# Patient Record
Sex: Female | Born: 1990 | Race: Black or African American | Hispanic: No | Marital: Single | Smoking: Former smoker
Health system: Southern US, Community
[De-identification: ages and names within clinical notes are randomized; demographics above are authoritative.]

## PROBLEM LIST (undated history)

## (undated) DIAGNOSIS — Z789 Other specified health status: Secondary | ICD-10-CM

## (undated) HISTORY — PX: NO PAST SURGERIES: SHX2092

---

## 2015-01-21 ENCOUNTER — Emergency Department (HOSPITAL_COMMUNITY)
Admission: EM | Admit: 2015-01-21 | Discharge: 2015-01-21 | Disposition: A | Discharge status: Home / Self Care | Payer: Self-pay | Attending: Emergency Medicine | Admitting: Emergency Medicine

## 2015-01-21 ENCOUNTER — Encounter (HOSPITAL_COMMUNITY): Payer: Self-pay | Admitting: Emergency Medicine

## 2015-01-21 DIAGNOSIS — Z720 Tobacco use: Secondary | ICD-10-CM | POA: Insufficient documentation

## 2015-01-21 DIAGNOSIS — K644 Residual hemorrhoidal skin tags: Secondary | ICD-10-CM | POA: Insufficient documentation

## 2015-01-21 DIAGNOSIS — Z79899 Other long term (current) drug therapy: Secondary | ICD-10-CM | POA: Insufficient documentation

## 2015-01-21 DIAGNOSIS — Z7951 Long term (current) use of inhaled steroids: Secondary | ICD-10-CM | POA: Insufficient documentation

## 2015-01-21 DIAGNOSIS — K921 Melena: Secondary | ICD-10-CM | POA: Insufficient documentation

## 2015-01-21 LAB — I-STAT CHEM 8, ED
BUN: 8 mg/dL (ref 6–20)
CALCIUM ION: 1.12 mmol/L (ref 1.12–1.23)
CHLORIDE: 105 mmol/L (ref 101–111)
CREATININE: 0.8 mg/dL (ref 0.44–1.00)
GLUCOSE: 100 mg/dL — AB (ref 65–99)
HCT: 40 % (ref 36.0–46.0)
Hemoglobin: 13.6 g/dL (ref 12.0–15.0)
Potassium: 3.8 mmol/L (ref 3.5–5.1)
Sodium: 137 mmol/L (ref 135–145)
TCO2: 22 mmol/L (ref 0–100)

## 2015-01-21 LAB — POC OCCULT BLOOD, ED: Fecal Occult Bld: POSITIVE — AB

## 2015-01-21 MED ORDER — HYDROCORTISONE 2.5 % RE CREA: TOPICAL_CREAM | RECTAL | Status: DC

## 2015-01-21 MED ORDER — DOCUSATE SODIUM 100 MG PO CAPS: 100.0000 mg | ORAL_CAPSULE | Freq: Two times a day (BID) | ORAL | Status: DC

## 2015-01-21 NOTE — ED Provider Notes (Signed)
Medical screening examination/treatment/procedure(s) were conducted as a shared visit with non-physician practitioner(s) and myself.  I personally evaluated the patient during the encounter.  Patient with rectal bleeding. Likely related to anal intercourse. No fevers, nausea, vomiting, vaginal or urinary symptoms. Exam done without evidence of hemorrhoids. She was Hemoccult positive. Has no abdominal tenderness heart regular rate and rhythm lungs clear to auscultation. Likely related to intercourse and possibly has some fissures. Doubt serious injuries. Treat supportively at home.   EKG Interpretation None        Marily Memos, MD 01/22/15 [phone]

## 2015-01-21 NOTE — ED Notes (Signed)
Pt states she is having blood in her stool  Pt states today is the 4th day she has had it  Pt states it is enough to turn the water in the toilet red  Pt states she had discomfort when she has a bowel movement  Pt states she is regular with her bowel movements  Pt states she does have anal sex

## 2015-01-21 NOTE — ED Provider Notes (Signed)
CSN: [id number]     Arrival date & time 01/21/15  2001 History   First MD Initiated Contact with Patient 01/21/15 2231     Chief Complaint  Patient presents with  . Melena    (Consider location/radiation/quality/duration/timing/severity/associated sxs/prior Treatment) HPI Comments: 24 year old female presents to the emergency department for further evaluation of acute ear. Patient states that she has had 4 days of blood mixed with brown stool when having a bowel movement only. She states that she has had enough blood to "turn the water in the toilet red". She denies any passage of clots or persistent bleeding following a bowel movement. She states that a bowel movement usually aggravates her rectal pain. No abdominal pain, fever, N/V, urinary symptoms, vaginal complaints. No hx of hemorrhoid or anal fissure. Patient does engage in anal sex; las occurrence was 1.5 weeks ago. No hx of abdominal surgeries. Patient is not on blood thinners.  The history is provided by the patient. No language interpreter was used.    History reviewed. No pertinent past medical history. History reviewed. No pertinent past surgical history. Family History  Problem Relation Age of Onset  . Diabetes Other    Social History  Substance Use Topics  . Smoking status: Current Every Day Smoker    Types: Cigarettes  . Smokeless tobacco: None  . Alcohol Use: Yes     Comment: occ   OB History    No data available      Review of Systems  Constitutional: Negative for fever.  Respiratory: Negative for shortness of breath.   Cardiovascular: Negative for chest pain.  Gastrointestinal: Positive for blood in stool and rectal pain.  Genitourinary: Negative for dysuria and hematuria.  All other systems reviewed and are negative.   Allergies  Review of patient's allergies indicates no known allergies.  Home Medications   Prior to Admission medications   Medication Sig Start Date End Date Taking? Authorizing  Provider  etonogestrel (NEXPLANON) 68 MG IMPL implant 1 each by Subdermal route once.   Yes Historical Provider, MD  Multiple Vitamin (MULTIVITAMIN WITH MINERALS) TABS tablet Take 1 tablet by mouth daily.   Yes Historical Provider, MD  triamcinolone (NASACORT) 55 MCG/ACT AERO nasal inhaler Place 2 sprays into the nose daily.   Yes Historical Provider, MD  docusate sodium (COLACE) 100 MG capsule Take 1 capsule (100 mg total) by mouth every 12 (twelve) hours. 01/21/15   Antony Madura, PA-C  hydrocortisone (ANUSOL-HC) 2.5 % rectal cream Apply rectally 2 times daily 01/21/15   Antony Madura, PA-C   BP 143/81 mmHg  Pulse 101  Temp(Src) 98.3 F (36.8 C) (Oral)  Resp 16  SpO2 100%  LMP 12/24/2014 (Approximate)   Physical Exam  Constitutional: She is oriented to person, place, and time. She appears well-developed and well-nourished. No distress.  Nontoxic/nonseptic appearing  HENT:  Head: Normocephalic and atraumatic.  Eyes: Conjunctivae and EOM are normal. No scleral icterus.  Neck: Normal range of motion.  Pulmonary/Chest: Effort normal. No respiratory distress. She has no wheezes.  Respirations even and unlabored  Abdominal: Soft. She exhibits no distension. There is no tenderness. There is no rebound and no guarding.  Soft, obese; nontender without peritoneal signs.  Genitourinary: Rectal exam shows external hemorrhoid (non-thrombosed at 6 o'clock; nontender). Rectal exam shows no internal hemorrhoid, no fissure, no mass, no tenderness and anal tone normal.  No anal fissure. No gross blood on DRE. Soft, brown stool noted.  Musculoskeletal: Normal range of motion.  Neurological:  She is alert and oriented to person, place, and time. She exhibits normal muscle tone. Coordination normal.  Skin: Skin is warm and dry. No rash noted. She is not diaphoretic. No erythema. No pallor.  Psychiatric: She has a normal mood and affect. Her behavior is normal.  Nursing note and vitals reviewed.   ED Course   Procedures (including critical care time) Labs Review Labs Reviewed  I-STAT CHEM 8, ED - Abnormal; Notable for the following:    Glucose, Bld 100 (*)    All other components within normal limits  POC OCCULT BLOOD, ED - Abnormal; Notable for the following:    Fecal Occult Bld POSITIVE (*)    All other components within normal limits    Imaging Review No results found.   EKG Interpretation None      MDM   Final diagnoses:  Hematochezia    Patient presenting for hematochezia x 4 days. DRE with no evidence of gross blood; hemoccult is positive for microscopic hematochezia, but H/H is WNL. Patient without hypotension. No c/o syncope or lightheadedness. She does have an external hemorrhoid on exam which does not appear thrombosed. Also possible that recent anal intercourse may have caused onset of symptoms today. Will treat supportively with sitz bath, Anusol HC, and Colace. Patient advised to follow up with her primary care doctor for recheck. Return precautions discussed and provided. Patient discharged in good condition unaddressed concerns.    Antony Madura, PA-C 01/21/15 2321  Marily Memos, MD 01/22/15 [phone]

## 2015-01-21 NOTE — Discharge Instructions (Signed)
Recommend you do sitz baths 3-4 times per day. Use Anusol HC as prescribed and Colace to prevent constipation. Avoid any anal trauma which may worsen symptoms. Follow up with your primary care doctor for symptoms recheck within the week.  Sitz Bath A sitz bath is a warm water bath taken in the sitting position that covers only the hips and buttocks. It may be used for either healing or hygiene purposes. Sitz baths are also used to relieve pain, itching, or muscle spasms. The water may contain medicine. Moist heat will help you heal and relax.  HOME CARE INSTRUCTIONS  Take 3 to 4 sitz baths a day. 1. Fill the bathtub half full with warm water. 2. Sit in the water and open the drain a little. 3. Turn on the warm water to keep the tub half full. Keep the water running constantly. 4. Soak in the water for 15 to 20 minutes. 5. After the sitz bath, pat the affected area dry first. SEEK MEDICAL CARE IF:  You get worse instead of better. Stop the sitz baths if you get worse. MAKE SURE YOU:  Understand these instructions.  Will watch your condition.  Will get help right away if you are not doing well or get worse. Document Released: 02/20/2004 Document Revised: 02/22/2012 Document Reviewed: 08/27/2010 [facility] Patient Information 2015 [place], [place]. This information is not intended to replace advice given to you by your health care provider. Make sure you discuss any questions you have with your health care provider.  Hemorrhoids Hemorrhoids are swollen veins around the rectum or anus. There are two types of hemorrhoids:  6. Internal hemorrhoids. These occur in the veins just inside the rectum. They may poke through to the outside and become irritated and painful. 7. External hemorrhoids. These occur in the veins outside the anus and can be felt as a painful swelling or hard lump near the anus. CAUSES  Pregnancy.   Obesity.   Constipation or diarrhea.   Straining to have a bowel  movement.   Sitting for long periods on the toilet.  Heavy lifting or other activity that caused you to strain.  Anal intercourse. SYMPTOMS   Pain.   Anal itching or irritation.   Rectal bleeding.   Fecal leakage.   Anal swelling.   One or more lumps around the anus.  DIAGNOSIS  Your caregiver may be able to diagnose hemorrhoids by visual examination. Other examinations or tests that may be performed include:   Examination of the rectal area with a gloved hand (digital rectal exam).   Examination of anal canal using a small tube (scope).   A blood test if you have lost a significant amount of blood.  A test to look inside the colon (sigmoidoscopy or colonoscopy). TREATMENT Most hemorrhoids can be treated at home. However, if symptoms do not seem to be getting better or if you have a lot of rectal bleeding, your caregiver may perform a procedure to help make the hemorrhoids get smaller or remove them completely. Possible treatments include:   Placing a rubber band at the base of the hemorrhoid to cut off the circulation (rubber band ligation).   Injecting a chemical to shrink the hemorrhoid (sclerotherapy).   Using a tool to burn the hemorrhoid (infrared light therapy).   Surgically removing the hemorrhoid (hemorrhoidectomy).   Stapling the hemorrhoid to block blood flow to the tissue (hemorrhoid stapling).  HOME CARE INSTRUCTIONS   Eat foods with fiber, such as whole grains, beans, nuts, fruits,  and vegetables. Ask your doctor about taking products with added fiber in them (fibersupplements).  Increase fluid intake. Drink enough water and fluids to keep your urine clear or pale yellow.   Exercise regularly.   Go to the bathroom when you have the urge to have a bowel movement. Do not wait.   Avoid straining to have bowel movements.   Keep the anal area dry and clean. Use wet toilet paper or moist towelettes after a bowel movement.   Medicated  creams and suppositories may be used or applied as directed.   Only take over-the-counter or prescription medicines as directed by your caregiver.   Take warm sitz baths for 15-20 minutes, 3-4 times a day to ease pain and discomfort.   Place ice packs on the hemorrhoids if they are tender and swollen. Using ice packs between sitz baths may be helpful.   Put ice in a plastic bag.   Place a towel between your skin and the bag.   Leave the ice on for 15-20 minutes, 3-4 times a day.   Do not use a donut-shaped pillow or sit on the toilet for long periods. This increases blood pooling and pain.  SEEK MEDICAL CARE IF:  You have increasing pain and swelling that is not controlled by treatment or medicine.  You have uncontrolled bleeding.  You have difficulty or you are unable to have a bowel movement.  You have pain or inflammation outside the area of the hemorrhoids. MAKE SURE YOU:  Understand these instructions.  Will watch your condition.  Will get help right away if you are not doing well or get worse. Document Released: 05/27/2000 Document Revised: 05/16/2012 Document Reviewed: 04/03/2012 [facility] Patient Information 2015 [place], [place]. This information is not intended to replace advice given to you by your health care provider. Make sure you discuss any questions you have with your health care provider.

## 2015-09-16 ENCOUNTER — Emergency Department (HOSPITAL_COMMUNITY)
Admission: EM | Admit: 2015-09-16 | Discharge: 2015-09-16 | Disposition: A | Discharge status: Home / Self Care | Payer: BC Managed Care – PPO | Attending: Emergency Medicine | Admitting: Emergency Medicine

## 2015-09-16 ENCOUNTER — Encounter (HOSPITAL_COMMUNITY): Payer: Self-pay

## 2015-09-16 DIAGNOSIS — N760 Acute vaginitis: Secondary | ICD-10-CM | POA: Insufficient documentation

## 2015-09-16 DIAGNOSIS — Z3202 Encounter for pregnancy test, result negative: Secondary | ICD-10-CM | POA: Diagnosis not present

## 2015-09-16 DIAGNOSIS — N939 Abnormal uterine and vaginal bleeding, unspecified: Secondary | ICD-10-CM

## 2015-09-16 DIAGNOSIS — B9689 Other specified bacterial agents as the cause of diseases classified elsewhere: Secondary | ICD-10-CM

## 2015-09-16 DIAGNOSIS — Z79899 Other long term (current) drug therapy: Secondary | ICD-10-CM | POA: Diagnosis not present

## 2015-09-16 DIAGNOSIS — F17210 Nicotine dependence, cigarettes, uncomplicated: Secondary | ICD-10-CM | POA: Insufficient documentation

## 2015-09-16 LAB — I-STAT BETA HCG BLOOD, ED (MC, WL, AP ONLY)

## 2015-09-16 LAB — COMPREHENSIVE METABOLIC PANEL
ALT: 16 U/L (ref 14–54)
AST: 17 U/L (ref 15–41)
Albumin: 4 g/dL (ref 3.5–5.0)
Alkaline Phosphatase: 41 U/L (ref 38–126)
Anion gap: 5 (ref 5–15)
BILIRUBIN TOTAL: 0.2 mg/dL — AB (ref 0.3–1.2)
BUN: 10 mg/dL (ref 6–20)
CO2: 24 mmol/L (ref 22–32)
Calcium: 8.6 mg/dL — ABNORMAL LOW (ref 8.9–10.3)
Chloride: 110 mmol/L (ref 101–111)
Creatinine, Ser: 0.81 mg/dL (ref 0.44–1.00)
GFR calc Af Amer: >60 mL/min (ref >60)
Glucose, Bld: 107 mg/dL — ABNORMAL HIGH (ref 65–99)
Potassium: 3.4 mmol/L — ABNORMAL LOW (ref 3.5–5.1)
Sodium: 139 mmol/L (ref 135–145)
TOTAL PROTEIN: 6.8 g/dL (ref 6.5–8.1)

## 2015-09-16 LAB — WET PREP, GENITAL
Sperm: NONE SEEN
Trich, Wet Prep: NONE SEEN
Yeast Wet Prep HPF POC: NONE SEEN

## 2015-09-16 LAB — CBC
HCT: 38.1 % (ref 36.0–46.0)
Hemoglobin: 13.1 g/dL (ref 12.0–15.0)
MCH: 29.6 pg (ref 26.0–34.0)
MCHC: 34.4 g/dL (ref 30.0–36.0)
MCV: 86.2 fL (ref 78.0–100.0)
PLATELETS: 246 10*3/uL (ref 150–400)
RBC: 4.42 MIL/uL (ref 3.87–5.11)
RDW: 12.8 % (ref 11.5–15.5)
WBC: 6 10*3/uL (ref 4.0–10.5)

## 2015-09-16 MED ORDER — CEFTRIAXONE SODIUM 250 MG IJ SOLR
250.0000 mg | Freq: Once | INTRAMUSCULAR | Status: AC
  Administered 2015-09-16: 250 mg via INTRAMUSCULAR
  Filled 2015-09-16: qty 250

## 2015-09-16 MED ORDER — STERILE WATER FOR INJECTION IJ SOLN
INTRAMUSCULAR | Status: AC
  Administered 2015-09-16: 10 mL
  Filled 2015-09-16: qty 10

## 2015-09-16 MED ORDER — METRONIDAZOLE 500 MG PO TABS: 500.0000 mg | ORAL_TABLET | Freq: Two times a day (BID) | ORAL | Status: DC

## 2015-09-16 MED ORDER — AZITHROMYCIN 250 MG PO TABS
1000.0000 mg | ORAL_TABLET | Freq: Once | ORAL | Status: AC
  Administered 2015-09-16: 1000 mg via ORAL
  Filled 2015-09-16: qty 4

## 2015-09-16 MED ORDER — TRIAMCINOLONE ACETONIDE 0.1 % EX CREA: 1.0000 "application " | TOPICAL_CREAM | Freq: Two times a day (BID) | CUTANEOUS | Status: DC

## 2015-09-16 NOTE — Discharge Instructions (Signed)
Ms. Donna Chavez,  Nice meeting you! Please follow-up with gynecology and establish care with a primary care provider. Return to the emergency department if you develop increased vaginal bleeding, abdominal pain, or new/worsening symptoms. Feel better soon!  S. Lane HackerNicole Dulcinea Kinser, PA-C

## 2015-09-16 NOTE — ED Notes (Signed)
Pt c/o reports increased vaginal bleeding and intermittent abdominal pain x 4-5 months.  Denies pain.  Pt reports having Nexplanon placed in May 2016.  [facility]plant was [facility] a [facility] [place].

## 2015-09-16 NOTE — ED Provider Notes (Signed)
CSN: [id number]     Arrival date & time 09/16/15  1317 History   First MD Initiated Contact with Patient 09/16/15 1733     Chief Complaint  Patient presents with  . Vaginal Bleeding   HPI   Donna Chavez is a 25 y.o. female with no significant past medical history presenting with a 1 month history of vaginal bleeding. She states over the last 22 days, she has had heavy, intermittent vaginal bleeding. She had nexplanon placed in May 2016 in [place]. She denies fevers, chills, chest pain, shortness of breath, abdominal pain, other vaginal complaints, injury, changes in bowel or bladder habits.  History reviewed. No pertinent past medical history. History reviewed. No pertinent past surgical history. Family History  Problem Relation Age of Onset  . Diabetes Other    Social History  Substance Use Topics  . Smoking status: Current Every Day Smoker    Types: Cigars  . Smokeless tobacco: None  . Alcohol Use: Yes     Comment: occ   OB History    No data available     Review of Systems  Ten systems are reviewed and are negative for acute change except as noted in the HPI  Allergies  Review of patient's allergies indicates no known allergies.  Home Medications   Prior to Admission medications   Medication Sig Start Date End Date Taking? Authorizing Provider  Calcium Polycarbophil (FIBER-CAPS PO) Take 1 capsule by mouth daily.   Yes Historical Provider, MD  etonogestrel (NEXPLANON) 68 MG IMPL implant 1 each by Subdermal route once. Placed 11/11/2014   Yes Historical Provider, MD  Multiple Vitamin (MULTIVITAMIN WITH MINERALS) TABS tablet Take 1 tablet by mouth daily. Reported on 09/16/2015   Yes Historical Provider, MD   BP 140/93 mmHg  Pulse 88  Temp(Src) 98.7 F (37.1 C) (Oral)  Resp 13  SpO2 97%  LMP 09/15/2015 Physical Exam  Constitutional: She appears well-developed and well-nourished. No distress.  HENT:  Head: Normocephalic and atraumatic.  Mouth/Throat: Oropharynx  is clear and moist. No oropharyngeal exudate.  Eyes: Conjunctivae are normal. Pupils are equal, round, and reactive to light. Right eye exhibits no discharge. Left eye exhibits no discharge. No scleral icterus.  Neck: No tracheal deviation present.  Cardiovascular: Normal rate, regular rhythm, normal heart sounds and intact distal pulses.  Exam reveals no gallop and no friction rub.   No murmur heard. Pulmonary/Chest: Effort normal and breath sounds normal. No respiratory distress. She has no wheezes. She has no rales. She exhibits no tenderness.  Abdominal: Soft. Bowel sounds are normal. She exhibits no distension and no mass. There is no tenderness. There is no rebound and no guarding.  Genitourinary:  Pelvic exam: normal external genitalia, vulva, vagina, uterus. Mild right adnexal tenderness. Invagination of cervix at 10 o'clock. Scant amount of blood in vaginal vault.    Musculoskeletal: She exhibits no edema.  Lymphadenopathy:    She has no cervical adenopathy.  Neurological: She is alert. Coordination normal.  Skin: Skin is warm and dry. No rash noted. She is not diaphoretic. No erythema.  Psychiatric: She has a normal mood and affect. Her behavior is normal.  Nursing note and vitals reviewed.   ED Course  Procedures  Labs Review Labs Reviewed  WET PREP, GENITAL - Abnormal; Notable for the following:    Clue Cells Wet Prep HPF POC PRESENT (*)    WBC, Wet Prep HPF POC FEW (*)    All other components within normal limits  COMPREHENSIVE  METABOLIC PANEL - Abnormal; Notable for the following:    Potassium 3.4 (*)    Glucose, Bld 107 (*)    Calcium 8.6 (*)    Total Bilirubin 0.2 (*)    All other components within normal limits  CBC  RPR  HIV ANTIBODY (ROUTINE TESTING)  I-STAT BETA HCG BLOOD, ED ([facility], [facility], [facility] ONLY)  GC/CHLAMYDIA PROBE AMP ([place]) NOT AT [facility]   MDM   Final diagnoses:  Vaginal bleeding  Bacterial vaginosis   Patient nontoxic-appearing, vital signs  stable. HCG, CMP, CBC unremarkable. Patient has been bleeding for approximately one month and hemoglobin is 13.1 today. Patient is not hypotensive, does not appear pale. Will perform pelvic exam and obtain STI workup, as well as refer to gynecology. Pelvic exam demonstrated scant amount of blood in vaginal vault.  Wet prep demonstrates bacterial vaginosis. Due to mild right adnexal tenderness, will treat empirically for STI today. Patient is requesting a prescription for triamcinolone cream for her eczema (her prescription ran out). Patient may be safely discharged at this time with Flagyl. Discussed return precautions. Patient in understanding and agreement with the plan. Patient is to follow-up with gynecology and primary care provider.  Melton Krebs, PA-C 09/16/15 2012  Arby Barrette, MD 09/17/15 [phone]

## 2015-09-16 NOTE — ED Notes (Signed)
Have made three calls to collect blood samples checked lobby, bathrooms,parking area,with no answer.

## 2015-09-17 ENCOUNTER — Telehealth (HOSPITAL_BASED_OUTPATIENT_CLINIC_OR_DEPARTMENT_OTHER): Payer: Self-pay | Admitting: Emergency Medicine

## 2015-09-17 LAB — GC/CHLAMYDIA PROBE AMP (~~LOC~~) NOT AT ARMC
CHLAMYDIA, DNA PROBE: NEGATIVE
Neisseria Gonorrhea: NEGATIVE

## 2015-09-17 LAB — RPR: RPR Ser Ql: NONREACTIVE

## 2015-09-17 LAB — HIV ANTIBODY (ROUTINE TESTING W REFLEX): HIV SCREEN 4TH GENERATION: NONREACTIVE

## 2016-11-24 ENCOUNTER — Ambulatory Visit (INDEPENDENT_AMBULATORY_CARE_PROVIDER_SITE_OTHER): Payer: Worker's Compensation

## 2016-11-24 ENCOUNTER — Encounter: Payer: Self-pay | Admitting: Emergency Medicine

## 2016-11-24 ENCOUNTER — Ambulatory Visit (INDEPENDENT_AMBULATORY_CARE_PROVIDER_SITE_OTHER): Payer: Worker's Compensation | Admitting: Emergency Medicine

## 2016-11-24 VITALS — BP 142/81 | HR 99 | Temp 98.1°F | Resp 16

## 2016-11-24 DIAGNOSIS — S93402A Sprain of unspecified ligament of left ankle, initial encounter: Secondary | ICD-10-CM

## 2016-11-24 DIAGNOSIS — S9002XA Contusion of left ankle, initial encounter: Secondary | ICD-10-CM

## 2016-11-24 DIAGNOSIS — S40011A Contusion of right shoulder, initial encounter: Secondary | ICD-10-CM

## 2016-11-24 DIAGNOSIS — S40021A Contusion of right upper arm, initial encounter: Secondary | ICD-10-CM
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
## 2016-11-24 NOTE — Patient Instructions (Addendum)
IF you received an x-ray today, you will receive an invoice from [facility]. Please contact [facility] at [phone] with questions or concerns regarding your invoice.   IF you received labwork today, you will receive an invoice from [facility]. Please contact [facility] at [phone] with questions or concerns regarding your invoice.   Our billing staff will not be able to assist you with questions regarding bills from these companies.  You will be contacted with the lab results as soon as they are available. The fastest way to get your results is to activate your My Chart account. Instructions are located on the last page of this paperwork. If you have not heard from us regarding the results in 2 weeks, please contact this office.      Contusion A contusion is a deep bruise. Contusions happen when an injury causes bleeding under the skin. Symptoms of bruising include pain, swelling, and discolored skin. The skin may turn blue, purple, or yellow. Follow these instructions at home:  Rest the injured area.  If told, put ice on the injured area. ? Put ice in a plastic bag. ? Place a towel between your skin and the bag. ? Leave the ice on for 20 minutes, 2-3 times per day.  If told, put light pressure (compression) on the injured area using an elastic bandage. Make sure the bandage is not too tight. Remove it and put it back on as told by your doctor.  If possible, raise (elevate) the injured area above the level of your heart while you are sitting or lying down.  Take over-the-counter and prescription medicines only as told by your doctor. Contact a doctor if:  Your symptoms do not get better after several days of treatment.  Your symptoms get worse.  You have trouble moving the injured area. Get help right away if:  You have very bad pain.  You have a loss of feeling (numbness) in a hand or foot.  Your hand or foot turns pale or cold. This  information is not intended to replace advice given to you by your health care provider. Make sure you discuss any questions you have with your health care provider. Document Released: 11/16/2007 Document Revised: 11/05/2015 Document Reviewed: 10/15/2014 Elsevier Interactive Patient Education  2018 [employer].  Ankle Sprain An ankle sprain is a stretch or tear in one of the tough tissues (ligaments) in your ankle. Follow these instructions at home:  Rest your ankle.  Take over-the-counter and prescription medicines only as told by your doctor.  For 2-3 days, keep your ankle higher than the level of your heart (elevated) as much as possible.  If directed, put ice on the area: ? Put ice in a plastic bag. ? Place a towel between your skin and the bag. ? Leave the ice on for 20 minutes, 2-3 times a day.  If you were given a brace: ? Wear it as told. ? Take it off to shower or bathe. ? Try not to move your ankle much, but wiggle your toes from time to time. This helps to prevent swelling.  If you were given an elastic bandage (dressing): ? Take it off when you shower or bathe. ? Try not to move your ankle much, but wiggle your toes from time to time. This helps to prevent swelling. ? Adjust the bandage to make it more comfortable if it feels too tight. ? Loosen the bandage if you lose feeling in your foot, your foot  tingles, or your foot gets cold and blue.  If you have crutches, use them as told by your doctor. Continue to use them until you can walk without feeling pain in your ankle. Contact a doctor if:  Your bruises or swelling are quickly getting worse.  Your pain does not get better after you take medicine. Get help right away if:  You cannot feel your toes or foot.  Your toes or your foot looks blue.  You have very bad pain that gets worse. This information is not intended to replace advice given to you by your health care provider. Make sure you discuss any questions  you have with your health care provider. Document Released: 11/16/2007 Document Revised: 11/05/2015 Document Reviewed: 12/30/2014 Elsevier Interactive Patient Education  [employer].

## 2016-11-25 ENCOUNTER — Encounter: Payer: Self-pay | Admitting: Physician Assistant

## 2016-11-25 ENCOUNTER — Ambulatory Visit (INDEPENDENT_AMBULATORY_CARE_PROVIDER_SITE_OTHER): Payer: Worker's Compensation | Admitting: Physician Assistant

## 2016-11-25 VITALS — BP 152/102 | HR 95 | Resp 16 | Ht 71.0 in | Wt 265.0 lb

## 2016-11-25 DIAGNOSIS — S93402A Sprain of unspecified ligament of left ankle, initial encounter: Secondary | ICD-10-CM | POA: Diagnosis not present

## 2016-11-25 MED ORDER — TRAMADOL HCL 50 MG PO TABS: 25.0000 mg | ORAL_TABLET | Freq: Three times a day (TID) | ORAL | 0 refills | Status: DC | PRN

## 2016-11-25 MED ORDER — NAPROXEN 500 MG PO TABS: 500.0000 mg | ORAL_TABLET | Freq: Two times a day (BID) | ORAL | 0 refills | Status: DC

## 2016-11-25 NOTE — Patient Instructions (Addendum)
Tylenol 1000 mg every 8 hours. COme back in two weeks if you are not fifty percent better.
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
## 2020-07-22 DIAGNOSIS — Z3046 Encounter for surveillance of implantable subdermal contraceptive: Secondary | ICD-10-CM | POA: Diagnosis not present

## 2020-07-22 DIAGNOSIS — Z30018 Encounter for initial prescription of other contraceptives: Secondary | ICD-10-CM | POA: Diagnosis not present

## 2021-02-24 LAB — OB RESULTS CONSOLE RUBELLA ANTIBODY, IGM: Rubella: IMMUNE

## 2021-02-24 LAB — OB RESULTS CONSOLE HIV ANTIBODY (ROUTINE TESTING): HIV: NONREACTIVE

## 2021-02-24 LAB — OB RESULTS CONSOLE HEPATITIS B SURFACE ANTIGEN: Hepatitis B Surface Ag: NEGATIVE

## 2021-02-24 LAB — HEPATITIS C ANTIBODY: HCV Ab: NEGATIVE

## 2021-02-24 LAB — OB RESULTS CONSOLE RPR: RPR: NONREACTIVE

## 2021-03-01 LAB — OB RESULTS CONSOLE ABO/RH: RH Type: POSITIVE

## 2021-03-01 LAB — OB RESULTS CONSOLE GC/CHLAMYDIA
Chlamydia: NEGATIVE
Gonorrhea: NEGATIVE

## 2021-03-01 LAB — OB RESULTS CONSOLE ANTIBODY SCREEN: Antibody Screen: NEGATIVE

## 2021-06-13 NOTE — L&D Delivery Note (Signed)
Delivery Note ? ? ?Patient Name: Donna Chavez ?DOB: Apr 07, 1991 ?MRN: [id number] ? ?Date of admission: 09/23/2021 ?Delivering MD: Oliver Hum, Tyjae Shvartsman  ?Date of delivery: 09/25/21 ?Type of delivery: SVD ? ?Newborn Data: ?Live born female  ?Birth Weight:   ?APGAR: 9, 9 ? ?Newborn Delivery   ?Birth date/time: 09/25/2021 19:31:00 ?Delivery type: Vaginal, Spontaneous ?  ? ?Donna Chavez, 31 y.o., @ [redacted]w[redacted]d,  G1P0, who was admitted for Donna Chavez, 31 y.o., G1P0, with an IUP @ [redacted]w[redacted]d, presented for IOL r/t maternal obesity, hsv no lesion on valtrex and new onset GHTN, PCR 0.09, others labs unremarkable, BP 144/89, ordered procardia 30mg  xl to start right after delivery. I was called to the room when she progressed 2+ station in the second stage of labor.  She pushed for 30/min.  She delivered a viable infant, cephalic and restituted to the ROA position over an intact perineum.  A nuchal cord was loose and reduced and x1 body cord   was identified. The baby was placed on maternal abdomen while initial step of NRP were perfmored (Dry, Stimulated, and warmed). Hat placed on baby for thermoregulation. Delayed cord clamping was performed for 2 minutes.  Cord double clamped and cut.  Cord cut by FOB. Apgar scores were 9 and 9. Prophylactic Pitocin was started in the third stage of labor for active management and TXA was given for boggy uterus right after delivery. The placenta delivered spontaneously, shultz, with a 3 vessel cord and was sent to parents on ice to go home with them.  Inspection revealed none. An examination of the vaginal vault and cervix was free from lacerations. The uterus was firm, bleeding stable.   Placenta and umbilical artery blood gas were not sent.  There were no complications during the procedure.  Mom and baby skin to skin following delivery. Left in stable condition. ? ?Maternal Info: ?Anesthesia: Epidural ?Episiotomy: no ?Lacerations:  no ?Suture Repair: no ?Est. Blood Loss (mL):  ? ?Newborn  Info: ? ?Baby Sex: female ?Circumcision: in pt circ desired ? ?APGAR (1 MIN): 9   ?APGAR (5 MINS): 9   ?APGAR (10 MINS):   ? ? ?Mom to postpartum.  Baby to Couplet care / Skin to Skin. ? ? ?[facility], [name], NP-C ?09/25/21 ?8:21 PM ? ? ? ?  ?

## 2021-08-24 LAB — OB RESULTS CONSOLE GBS: GBS: NEGATIVE

## 2021-09-08 ENCOUNTER — Other Ambulatory Visit: Payer: Self-pay | Admitting: Obstetrics and Gynecology

## 2021-09-09 ENCOUNTER — Telehealth (HOSPITAL_COMMUNITY): Payer: Self-pay | Admitting: *Deleted

## 2021-09-09 ENCOUNTER — Encounter (HOSPITAL_COMMUNITY): Payer: Self-pay | Admitting: *Deleted

## 2021-09-09 NOTE — Telephone Encounter (Signed)
Preadmission screen  

## 2021-09-10 ENCOUNTER — Telehealth (HOSPITAL_COMMUNITY): Payer: Self-pay | Admitting: *Deleted

## 2021-09-10 NOTE — Telephone Encounter (Signed)
Preadmission screen  

## 2021-09-13 ENCOUNTER — Telehealth (HOSPITAL_COMMUNITY): Payer: Self-pay | Admitting: *Deleted

## 2021-09-13 NOTE — Telephone Encounter (Signed)
Preadmission screen  

## 2021-09-14 ENCOUNTER — Telehealth (HOSPITAL_COMMUNITY): Payer: Self-pay | Admitting: *Deleted

## 2021-09-14 NOTE — Telephone Encounter (Signed)
Preadmission screen  

## 2021-09-15 ENCOUNTER — Telehealth (HOSPITAL_COMMUNITY): Payer: Self-pay | Admitting: *Deleted

## 2021-09-15 ENCOUNTER — Encounter (HOSPITAL_COMMUNITY): Payer: Self-pay

## 2021-09-15 NOTE — Telephone Encounter (Signed)
Preadmission screen  

## 2021-09-16 ENCOUNTER — Telehealth (HOSPITAL_COMMUNITY): Payer: Self-pay | Admitting: *Deleted

## 2021-09-16 NOTE — Telephone Encounter (Signed)
Preadmission screen  

## 2021-09-17 ENCOUNTER — Telehealth (HOSPITAL_COMMUNITY): Payer: Self-pay | Admitting: *Deleted

## 2021-09-17 NOTE — Telephone Encounter (Signed)
Preadmission screen  

## 2021-09-21 ENCOUNTER — Telehealth (HOSPITAL_COMMUNITY): Payer: Self-pay | Admitting: *Deleted

## 2021-09-21 NOTE — Telephone Encounter (Signed)
Preadmission screen  

## 2021-09-22 ENCOUNTER — Telehealth (HOSPITAL_COMMUNITY): Payer: Self-pay | Admitting: *Deleted

## 2021-09-22 NOTE — Telephone Encounter (Signed)
Preadmission screen  

## 2021-09-23 ENCOUNTER — Encounter (HOSPITAL_COMMUNITY): Payer: Self-pay | Admitting: Obstetrics & Gynecology

## 2021-09-23 ENCOUNTER — Inpatient Hospital Stay (HOSPITAL_COMMUNITY): Payer: BC Managed Care – PPO

## 2021-09-23 ENCOUNTER — Inpatient Hospital Stay (HOSPITAL_COMMUNITY)
Admission: AD | Admit: 2021-09-23 | Discharge: 2021-09-27 | DRG: 806 | Disposition: A | Discharge status: Home / Self Care | Payer: BC Managed Care – PPO | Attending: Obstetrics & Gynecology | Admitting: Obstetrics & Gynecology

## 2021-09-23 ENCOUNTER — Other Ambulatory Visit: Payer: Self-pay

## 2021-09-23 DIAGNOSIS — O9832 Other infections with a predominantly sexual mode of transmission complicating childbirth: Secondary | ICD-10-CM | POA: Diagnosis present

## 2021-09-23 DIAGNOSIS — O9902 Anemia complicating childbirth: Secondary | ICD-10-CM | POA: Diagnosis present

## 2021-09-23 DIAGNOSIS — Z3A40 40 weeks gestation of pregnancy: Secondary | ICD-10-CM | POA: Diagnosis not present

## 2021-09-23 DIAGNOSIS — O99214 Obesity complicating childbirth: Principal | ICD-10-CM | POA: Diagnosis present

## 2021-09-23 DIAGNOSIS — A6000 Herpesviral infection of urogenital system, unspecified: Secondary | ICD-10-CM | POA: Diagnosis present

## 2021-09-23 DIAGNOSIS — Z87891 Personal history of nicotine dependence: Secondary | ICD-10-CM

## 2021-09-23 DIAGNOSIS — Z3490 Encounter for supervision of normal pregnancy, unspecified, unspecified trimester: Secondary | ICD-10-CM | POA: Diagnosis present

## 2021-09-23 DIAGNOSIS — D509 Iron deficiency anemia, unspecified: Secondary | ICD-10-CM | POA: Diagnosis present

## 2021-09-23 DIAGNOSIS — O99213 Obesity complicating pregnancy, third trimester: Secondary | ICD-10-CM | POA: Diagnosis present

## 2021-09-23 DIAGNOSIS — O133 Gestational [pregnancy-induced] hypertension without significant proteinuria, third trimester: Secondary | ICD-10-CM | POA: Diagnosis not present

## 2021-09-23 DIAGNOSIS — O134 Gestational [pregnancy-induced] hypertension without significant proteinuria, complicating childbirth: Secondary | ICD-10-CM | POA: Diagnosis present

## 2021-09-23 HISTORY — DX: Other specified health status: Z78.9

## 2021-09-23 LAB — CBC
HCT: 30.5 % — ABNORMAL LOW (ref 36.0–46.0)
HCT: 31.7 % — ABNORMAL LOW (ref 36.0–46.0)
Hemoglobin: 10.3 g/dL — ABNORMAL LOW (ref 12.0–15.0)
Hemoglobin: 10.6 g/dL — ABNORMAL LOW (ref 12.0–15.0)
MCH: 29.9 pg (ref 26.0–34.0)
MCH: 29.9 pg (ref 26.0–34.0)
MCHC: 33.8 g/dL (ref 30.0–36.0)
MCHC: 33.4 g/dL (ref 30.0–36.0)
MCV: 88.7 fL (ref 80.0–100.0)
MCV: 89.5 fL (ref 80.0–100.0)
Platelets: 251 10*3/uL (ref 150–400)
Platelets: 246 10*3/uL (ref 150–400)
RBC: 3.44 MIL/uL — ABNORMAL LOW (ref 3.87–5.11)
RBC: 3.54 MIL/uL — ABNORMAL LOW (ref 3.87–5.11)
RDW: 14.9 % (ref 11.5–15.5)
RDW: 15.1 % (ref 11.5–15.5)
WBC: 8.6 10*3/uL (ref 4.0–10.5)
WBC: 8 10*3/uL (ref 4.0–10.5)
nRBC: 0 % (ref 0.0–0.2)
nRBC: 0 % (ref 0.0–0.2)

## 2021-09-23 LAB — COMPREHENSIVE METABOLIC PANEL
ALT: 15 U/L (ref 0–44)
AST: 17 U/L (ref 15–41)
Albumin: 2.7 g/dL — ABNORMAL LOW (ref 3.5–5.0)
Alkaline Phosphatase: 168 U/L — ABNORMAL HIGH (ref 38–126)
Anion gap: 6 (ref 5–15)
BUN: <5 mg/dL — ABNORMAL LOW (ref 6–20)
CO2: 22 mmol/L (ref 22–32)
Calcium: 8.6 mg/dL — ABNORMAL LOW (ref 8.9–10.3)
Chloride: 107 mmol/L (ref 98–111)
Creatinine, Ser: 0.61 mg/dL (ref 0.44–1.00)
GFR, Estimated: >60 mL/min (ref >60)
Glucose, Bld: 97 mg/dL (ref 70–99)
Potassium: 3.9 mmol/L (ref 3.5–5.1)
Sodium: 135 mmol/L (ref 135–145)
Total Bilirubin: 0.4 mg/dL (ref 0.3–1.2)
Total Protein: 5.6 g/dL — ABNORMAL LOW (ref 6.5–8.1)

## 2021-09-23 LAB — TYPE AND SCREEN
ABO/RH(D): B POS
Antibody Screen: NEGATIVE

## 2021-09-23 LAB — PROTEIN / CREATININE RATIO, URINE
Creatinine, Urine: 141.69 mg/dL
Protein Creatinine Ratio: 0.09 mg/mg{Cre} (ref 0.00–0.15)
Total Protein, Urine: 13 mg/dL

## 2021-09-23 LAB — RPR: RPR Ser Ql: NONREACTIVE

## 2021-09-23 MED ORDER — LACTATED RINGERS IV SOLN
INTRAVENOUS | Status: DC
  Administered 2021-09-24: 300 mL via INTRAVENOUS
  Administered 2021-09-24: 1000 mL via INTRAVENOUS

## 2021-09-23 MED ORDER — LABETALOL HCL 5 MG/ML IV SOLN: 20.0000 mg | INTRAVENOUS | Status: DC | PRN

## 2021-09-23 MED ORDER — FENTANYL CITRATE (PF) 100 MCG/2ML IJ SOLN
50.0000 ug | INTRAMUSCULAR | Status: DC | PRN
  Administered 2021-09-24 (×2): 100 ug via INTRAVENOUS
  Filled 2021-09-23 (×2): qty 2

## 2021-09-23 MED ORDER — OXYTOCIN BOLUS FROM INFUSION
333.0000 mL | Freq: Once | INTRAVENOUS | Status: AC
Start: 2021-09-23 — End: 2021-09-25
  Administered 2021-09-25: 333 mL via INTRAVENOUS

## 2021-09-23 MED ORDER — ACETAMINOPHEN 325 MG PO TABS: 650.0000 mg | ORAL_TABLET | ORAL | Status: DC | PRN

## 2021-09-23 MED ORDER — HYDRALAZINE HCL 20 MG/ML IJ SOLN: 10.0000 mg | INTRAMUSCULAR | Status: DC | PRN

## 2021-09-23 MED ORDER — LABETALOL HCL 5 MG/ML IV SOLN: 40.0000 mg | INTRAVENOUS | Status: DC | PRN

## 2021-09-23 MED ORDER — LIDOCAINE HCL (PF) 1 % IJ SOLN: 30.0000 mL | INTRAMUSCULAR | Status: DC | PRN

## 2021-09-23 MED ORDER — OXYTOCIN-SODIUM CHLORIDE 30-0.9 UT/500ML-% IV SOLN: 2.5000 [IU]/h | INTRAVENOUS | Status: DC

## 2021-09-23 MED ORDER — MISOPROSTOL 50MCG HALF TABLET
50.0000 ug | ORAL_TABLET | ORAL | Status: DC | PRN
  Administered 2021-09-23 (×4): 50 ug via BUCCAL
  Filled 2021-09-23 (×4): qty 1

## 2021-09-23 MED ORDER — ONDANSETRON HCL 4 MG/2ML IJ SOLN
4.0000 mg | Freq: Four times a day (QID) | INTRAMUSCULAR | Status: DC | PRN
  Administered 2021-09-24: 4 mg via INTRAVENOUS
  Filled 2021-09-23 (×2): qty 2

## 2021-09-23 MED ORDER — SOD CITRATE-CITRIC ACID 500-334 MG/5ML PO SOLN
30.0000 mL | ORAL | Status: DC | PRN
  Administered 2021-09-23 – 2021-09-24 (×2): 30 mL via ORAL
  Filled 2021-09-23 (×2): qty 30

## 2021-09-23 MED ORDER — MISOPROSTOL 25 MCG QUARTER TABLET
25.0000 ug | ORAL_TABLET | ORAL | Status: DC | PRN
Start: 2021-09-23 — End: 2021-09-25
  Administered 2021-09-23: 25 ug via VAGINAL
  Filled 2021-09-23: qty 1

## 2021-09-23 MED ORDER — LACTATED RINGERS IV SOLN: 500.0000 mL | INTRAVENOUS | Status: DC | PRN

## 2021-09-23 MED ORDER — LABETALOL HCL 5 MG/ML IV SOLN: 80.0000 mg | INTRAVENOUS | Status: DC | PRN

## 2021-09-23 MED ORDER — TERBUTALINE SULFATE 1 MG/ML IJ SOLN: 0.2500 mg | Freq: Once | INTRAMUSCULAR | Status: DC | PRN

## 2021-09-23 NOTE — Progress Notes (Signed)
MD LABOR PROGRESS NOTE ? ?Donna Chavez is a 31 y.o. G1P0 at [redacted]w[redacted]d admitted for induction of labor due to obesity. ? ?Subjective: ?Patient w/o complaints ? ?Objective: ?BP 134/72 (BP Location: Right Arm)   Pulse 96   Temp 98 ?F (36.7 ?C) (Oral)   Resp 18   Ht 5\' 11"  (1.803 m)   Wt 135.5 kg   BMI 41.67 kg/m?  ?No intake/output data recorded. ? ?FHT:  FHR: 125-130 bpm, variability: moderate,  accelerations:  Present,  decelerations:  Absent ?UC:   irregular, irritability ?SVE:   Dilation: Fingertip ?Effacement (%): Thick ?Station: Ballotable ?Exam by:: August Albino RNC ? ?Labs: ?Lab Results  ?Component Value Date  ? WBC 8.6 09/23/2021  ? HGB 10.3 (L) 09/23/2021  ? HCT 30.5 (L) 09/23/2021  ? MCV 88.7 09/23/2021  ? PLT 251 09/23/2021  ? ? ?Assessment / Plan: ?Induction of labor due to macrosomia, s/p 3 doses of misoprostol 50 mcg buccally ? ?Labor: Progressing normally still in latent labor ?Fetal Wellbeing:  Category I ?Pain Control:  Labor support without medications ?I/D:  n/a ?Anticipated MOD:  NSVD ? ?Sanjuana Kava MD ?09/23/2021, 5:29 PM ?  ?

## 2021-09-23 NOTE — H&P (Signed)
OB ADMISSION/ HISTORY & PHYSICAL: ? ?Admission Date: 09/23/2021 12:03 AM  ?Admit Diagnosis: Normal labor ? ?Donna Chavez is a 31 y.o. female G1P0 [redacted]w[redacted]d presenting for induction of labor for maternal obesity, BMI 41. Endorses active FM, denies LOF and vaginal bleeding. Hx of HSV, on suppression since 36 wks.  ? ?History of current pregnancy: ?G1P0   ?Patient entered care with [facility] at 12+4 wks.   ?EDC 09/23/21 by LMP and congruent w/ 10+2 wk U/S.   ?Anatomy scan:  20+6 wks, complete w/ posterior placenta.   ?Antenatal testing: for maternal obesity started at 34 weeks ?Last evaluation: 39+5  wks vertex, posterior placenta, AFI 12, EFW 8+10, 78% ? ?Significant prenatal events:  ?Patient Active Problem List  ? Diagnosis Date Noted  ? Normal labor 09/23/2021  ? Maternal morbid obesity in third trimester, antepartum (HCC) 09/23/2021  ? 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 ? 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 ? ?OB History  ?Gravida Para Term Preterm AB Living  ?1            ?SAB IAB Ectopic Multiple Live Births  ?           ?  ?# Outcome Date GA Lbr Len/2nd Weight Sex Delivery Anes PTL Lv  ?1 Current           ? ? ?Medical / Surgical History: ?Past medical history:  ?Past Medical History:  ?Diagnosis Date  ? Medical history non-contributory   ?  ?Past surgical history:  ?Past Surgical History:  ?Procedure Laterality Date  ? NO PAST SURGERIES    ? ?Family History:  ?Family History  ?Problem Relation Age of Onset  ? Diabetes Other   ?  ?Social History:  reports that she has quit smoking. Her smoking use included cigars. She has never used smokeless tobacco. She reports that she does not currently use alcohol. She reports  that she does not use drugs. ? ?Allergies: ?Patient has no known allergies. ?  ?Current Medications at time of admission:  ?Prior to Admission medications   ?Medication Sig Start Date End Date Taking? Authorizing Provider  ?valACYclovir (VALTREX) 500 MG tablet Take 500 mg by mouth 2 (two) times daily.   Yes [provider]  ?Aspirin-Acetaminophen-Caffeine (EXCEDRIN PO) Take by mouth as needed.    [provider]  ?Calcium Polycarbophil (FIBER-CAPS PO) Take 1 capsule by mouth daily.    [provider]  ?etonogestrel (NEXPLANON) 68 MG IMPL implant 1 each by Subdermal route once. Placed 11/11/2014    [provider]  ?Multiple Vitamin (MULTIVITAMIN WITH MINERALS) TABS tablet Take 1 tablet by mouth daily. Reported on 09/16/2015    [provider]  ?naproxen (NAPROSYN) 500 MG tablet Take 1 tablet (500 mg total) by mouth 2 (two) times daily with a meal. 11/25/16   Ofilia Neas, PA-C  ?traMADol (ULTRAM) 50 MG tablet Take 0.5-1 tablets (25-50 mg total) by mouth every 8 (eight) hours as needed. 11/25/16   Ofilia Neas, PA-C  ?triamcinolone cream (KENALOG) 0.1 % Apply 1 application topically 2 (two) times daily. 09/16/15   Melton Krebs, PA-C  ? ? ?Review of Systems: ?Constitutional: Negative   ?HENT: Negative   ?Eyes: Negative   ?Respiratory: Negative   ?  Cardiovascular: Negative   ?Gastrointestinal: Negative  ?Genitourinary: neg for bloody show, neg for LOF   ?Musculoskeletal: Negative   ?Skin: Negative   ?Neurological: Negative   ?Endo/Heme/Allergies: Negative   ?Psychiatric/Behavioral: Negative  ? ? ?Physical Exam: ?VS: Blood pressure (!) 146/74, pulse 94, temperature 98.3 ?F (36.8 ?C), temperature source Oral, resp. rate 17, height 5\' 11"  (1.803 m), weight 135.5 kg. ?AAO x3, no signs of distress ?Cardiovascular: RRR ?Respiratory: Lung fields clear to ausculation ?GU/GI: Abdomen gravid, non-tender, non-distended, active FM, vertex ?Extremities: trace edema, negative  for pain, tenderness, and cords ? ?Cervical exam:Dilation: Closed ?Effacement (%): Thick ?Station: Ballotable ?Exam by:: Niranjan Rufener,cnm ?FHR: baseline rate 130 / variability moderate / accelerations present / absent decelerations ?TOCO: none per TOCO, none palpated ? ? ?Prenatal Transfer Tool  ?Maternal Diabetes: No ?Genetic Screening: low-risk Panorama, SMA carrier  without partner testing ?Maternal Ultrasounds/Referrals: Normal ?Fetal Ultrasounds or other Referrals:  None ?Maternal Substance Abuse:  No ?Significant Maternal Medications:  None ?Significant Maternal Lab Results: Group B Strep negative ? ? ? ?Assessment: ?31 y.o. G1P0 [redacted]w[redacted]d ? ?Induction of labor ?Maternal obesity, BMI 41 ?HSV-no prodromal S/S ?Iron deficiency anemia  ?FHR category 1 ?GBS neg ?Pain management plan: Epidural ? ? ?Plan:  ?Admit to L&D ?Routine admission orders ?Epidural PRN ?Cytotec 50 mcg buccal ?Dr [redacted]w[redacted]d notified of admission and plan of care ? ?Su Hilt MSN, CNM ?09/23/2021 5:23 AM ? ?

## 2021-09-23 NOTE — Progress Notes (Signed)
MD LABOR PROGRESS NOTE ? ?Donna Chavez is a 31 y.o. G1P0 at [redacted]w[redacted]d admitted for induction of labor due to maternal obesity. ? ?Subjective: ?Patient having some pelvic discomfort ? ?Objective: ?BP (!) 136/93   Pulse (!) 103   Temp 98.1 ?F (36.7 ?C) (Oral)   Resp 16   Ht 5\' 11"  (1.803 m)   Wt 135.5 kg   BMI 41.67 kg/m?   ?No intake/output data recorded. ? ?FHT:  FHR: 125 bpm, variability: moderate,  accelerations:  Present,  decelerations:  Absent ?UC:   none ?SVE:   Dilation: Closed ?Effacement (%): Thick ?Station: Ballotable ?Exam by:: Grice,cnm ? ?Labs: ?Lab Results  ?Component Value Date  ? WBC 8.6 09/23/2021  ? HGB 10.3 (L) 09/23/2021  ? HCT 30.5 (L) 09/23/2021  ? MCV 88.7 09/23/2021  ? PLT 251 09/23/2021  ? ? ?Assessment / Plan: ?Induction of labor due to maternal obesity at term. S/p two doses of Misoprostol ?Will re-check cervix at 0930 and if at least 1cm discussed placing induction balloon.  ? ?Labor:  latent ?Fetal Wellbeing:  Category I ?Pain Control:  Labor support without medications ?I/D:  n/a ?Anticipated MOD:  NSVD ? ?09/25/2021 MD ?09/23/2021, 8:41 AM ?  ?

## 2021-09-23 NOTE — Progress Notes (Addendum)
Subjective:   ? ?Expressed concerns for why labor has not started and if induction is necessary. Informed pt on risks associated with maternal obesity and the risk for shoulder dystocia if birth is delayed for an additional week. Questions answered and pt agrees to continue with induction. Does not perceive contractions, active fetal movement, and Cat 1 surveillance. Intermittent monitoring to allow for an uninterrupted nap.   ? ?Objective:   ? ?VS: BP 127/86   Pulse 91   Temp 98.3 ?F (36.8 ?C) (Oral)   Resp 18   Ht 5\' 11"  (1.803 m)   Wt 135.5 kg   BMI 41.67 kg/m?  ?FHR : baseline 140 / variability moderate / accelerations present/ absent decelerations ?Toco: irregular ?Membranes: intact ?Dilation: Closed ?Effacement (%): Thick ?Station: -3 ?Presentation: Vertex ?Exam by:: Nilsa Nutting, RN ? ?Assessment/Plan:  ? ?31 y.o. G1P0 [redacted]w[redacted]d ?IOL for maternal obesity ?GHTN-labile BP, PCR 0.09, CMP normal ?HSV- suppression since 36 weeks, no lesions, no prodromal symptoms ? ?Labor:  buccal Cytotec x3, vaginal x2 ?GHTN:  no signs or symptoms of toxicity, intake and ouput balanced, and labs stable ?Fetal Wellbeing:  Category I ?Pain Control:  Labor support without medications ?I/D:   GBS  ?Anticipated MOD:  NSVD ?Dr. Mancel Bale updated ? ?Donna Eastern MSN, CNM ?09/23/2021 10:59 PM ? ?

## 2021-09-24 DIAGNOSIS — Z3490 Encounter for supervision of normal pregnancy, unspecified, unspecified trimester: Secondary | ICD-10-CM | POA: Diagnosis present

## 2021-09-24 LAB — CBC WITH DIFFERENTIAL/PLATELET
Abs Immature Granulocytes: 0.04 10*3/uL (ref 0.00–0.07)
Basophils Absolute: 0 10*3/uL (ref 0.0–0.1)
Basophils Relative: 0 %
Eosinophils Absolute: 0.1 10*3/uL (ref 0.0–0.5)
Eosinophils Relative: 2 %
HCT: 29.2 % — ABNORMAL LOW (ref 36.0–46.0)
Hemoglobin: 10.2 g/dL — ABNORMAL LOW (ref 12.0–15.0)
Immature Granulocytes: 1 %
Lymphocytes Relative: 19 %
Lymphs Abs: 1.6 10*3/uL (ref 0.7–4.0)
MCH: 30.6 pg (ref 26.0–34.0)
MCHC: 34.9 g/dL (ref 30.0–36.0)
MCV: 87.7 fL (ref 80.0–100.0)
Monocytes Absolute: 0.7 10*3/uL (ref 0.1–1.0)
Monocytes Relative: 8 %
Neutro Abs: 5.9 10*3/uL (ref 1.7–7.7)
Neutrophils Relative %: 70 %
Platelets: 209 10*3/uL (ref 150–400)
RBC: 3.33 MIL/uL — ABNORMAL LOW (ref 3.87–5.11)
RDW: 14.8 % (ref 11.5–15.5)
WBC: 8.3 10*3/uL (ref 4.0–10.5)
nRBC: 0 % (ref 0.0–0.2)

## 2021-09-24 MED ORDER — EPHEDRINE 5 MG/ML INJ: 10.0000 mg | INTRAVENOUS | Status: DC | PRN

## 2021-09-24 MED ORDER — TERBUTALINE SULFATE 1 MG/ML IJ SOLN: 0.2500 mg | Freq: Once | INTRAMUSCULAR | Status: DC | PRN

## 2021-09-24 MED ORDER — FENTANYL-BUPIVACAINE-NACL 0.5-0.125-0.9 MG/250ML-% EP SOLN
12.0000 mL/h | EPIDURAL | Status: DC | PRN
  Administered 2021-09-25: 12 mL/h via EPIDURAL
  Filled 2021-09-24 (×2): qty 250

## 2021-09-24 MED ORDER — OXYTOCIN-SODIUM CHLORIDE 30-0.9 UT/500ML-% IV SOLN
1.0000 m[IU]/min | INTRAVENOUS | Status: DC
  Administered 2021-09-24: 2 m[IU]/min via INTRAVENOUS
  Administered 2021-09-25 (×2): 30 m[IU]/min via INTRAVENOUS
  Filled 2021-09-24 (×2): qty 500

## 2021-09-24 MED ORDER — DIPHENHYDRAMINE HCL 50 MG/ML IJ SOLN: 12.5000 mg | INTRAMUSCULAR | Status: DC | PRN

## 2021-09-24 MED ORDER — PHENYLEPHRINE 40 MCG/ML (10ML) SYRINGE FOR IV PUSH (FOR BLOOD PRESSURE SUPPORT): 80.0000 ug | PREFILLED_SYRINGE | INTRAVENOUS | Status: DC | PRN

## 2021-09-24 MED ORDER — MAGNESIUM GLUCONATE 500 MG PO TABS
500.0000 mg | ORAL_TABLET | Freq: Every day | ORAL | Status: DC
  Administered 2021-09-24: 500 mg via ORAL
  Filled 2021-09-24 (×2): qty 1

## 2021-09-24 MED ORDER — LACTATED RINGERS IV SOLN
500.0000 mL | Freq: Once | INTRAVENOUS | Status: AC
  Administered 2021-09-25: 500 mL via INTRAVENOUS

## 2021-09-24 NOTE — Progress Notes (Signed)
IOL for maternal obesity with new onset GHTN. Cytotec x5 doses total. Fifth dose found un-dissolved in posterior fornix. Attempt to place cook balloon with speculum was unsuccessful. Pt tolerated well. Discussed low-dose Pitocin and pt agrees.  ? ?Subjective:   ? ?Resting, denies pain.  ? ?Objective:   ? ?VS: BP 127/86   Pulse 91   Temp 98.3 ?F (36.8 ?C) (Oral)   Resp 18   Ht 5\' 11"  (1.803 m)   Wt 135.5 kg   BMI 41.67 kg/m?  ?FHR : baseline 135 / variability moderate / accelerations present / absent decelerations ?Toco: occasional ctx ?Membranes: intact ?Dilation: 1 ?Effacement (%): Thick ?Station: -3 ?Presentation: Vertex ?Exam by:: [email or web address] ? ? ?Assessment/Plan:  ? ?31 y.o. G1P0 [redacted]w[redacted]d ?IOL for maternal obesity ?GHTN-labile BP, PCR 0.09, CMP normal ?HSV- suppression since 36 weeks, no lesions, no prodromal symptoms ?  ?Labor:  buccal Cytotec x3, vaginal x2, starting low-dose Pitocin ?GHTN:  no signs or symptoms of toxicity, intake and ouput balanced, and labs stable ?Fetal Wellbeing:  Category I ?Pain Control:  Labor support without medications ?I/D:   GBS  ?Anticipated MOD:  NSVD ? ?[redacted]w[redacted]d MSN, CNM ?09/24/2021 1:56 AM ?  ?

## 2021-09-24 NOTE — Progress Notes (Signed)
Labor Progress Note ? ?Donna Chavez, 31 y.o., G1P0, with an IUP @ [redacted]w[redacted]d, presented for IOL r/t maternal obesity, hsv no lesion on valtrex and new onset GHTN, PCR 0.09, others labs unremarkable. ? ?Subjective: ?Pt STILL RESTING WELL, STARTING TO FEEL CXT A LITTLE BIT MORE, BUT STILL TOLERATES WELL. Will continue on pitocin. Pt went for and tolerated well. Pt desire to eat and shower.  ?Patient Active Problem List  ? Diagnosis Date Noted  ? Encounter for induction of labor 09/24/2021  ? Maternal morbid obesity in third trimester, antepartum ([place]) 09/23/2021  ? 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 Gestational hypertension w/o significant proteinuria in 3rd trimester 09/23/2021  ? ?Objective: ?BP 124/69   Pulse 87   Temp (!) 97.4 ?F (36.3 ?C) (Axillary)   Resp 18   Ht 5\' 11"  (1.803 m)   Wt 135.5 kg   BMI 41.67 kg/m?  ?No intake/output data recorded. ?No intake/output data recorded. ?NST: FHR baseline 130 bpm, Variability: moderate, Accelerations:present, Decelerations:  Absent= Cat 1/Reactive ?CTX:  1.5-42mins, lasts 50 seconds.  ?Uterus gravid, soft non tender, moderate to palpate with contractions.  ?SVE:  Dilation: 1.5 ?Effacement (%): 40 ?Station: -3 ?Exam by:: Luvenia Starch, CNM ?Pitocin at 20 mUn/min ? ?Assessment:  ?Donna Chavez, 31 y.o., G1P0, with an IUP @ [redacted]w[redacted]d, presented for IOL r/t maternal obesity, hsv no lesion on valtrex and new onset GHTN, PCR 0.09, oters labs unremarkable. Pt progressing slowly in early labor. Feeling mild cxt and tolerates well.  ?Patient Active Problem List  ? Diagnosis Date Noted  ? Encounter for induction of labor 09/24/2021  ? Maternal morbid obesity in third trimester, antepartum ([place]) 09/23/2021  ? 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 Gestational hypertension w/o significant proteinuria in 3rd trimester 09/23/2021  ? ?NICHD: Category 1 ? ?Membranes: Intact, no s/s of infection ? ?Induction:   ?Cytotec x4/13 @ ( 54mcg PO: Bright, 0518, [id number], 1413), (25mcg vaginal: 2135) ?Foley Bulb: Attempted to insert  x2 over last 12 hours and unsuccessful last at 0730 4/14.  ? Pitocin - 20 ? ?Pain management:   ?            IV pain management: x PRN ? Nitrous: PRN ?            Epidural placement:  PRN ? ?GBS Negative ? ?GHTN: BP 124/69, asymptomatic, stable, PCR 0.09, other labs unremarkable.  ? ? ?Plan: ?Continue labor plan ?GHTN: monitor BP, repeat labs in the morning, labetalol protocol PRN for Bp >160/110. PO Magnesium gluconate started.  ?Continuous monitoring ?Rest ?Ambulate ?Frequent position changes to facilitate fetal rotation and descent. ?Will reassess with cervical exam at 4 hours or earlier if necessary ?Continue pitocin per protocol max of 20 for now.  ?If no significant change by dinner time will pit break and pt may eat and shower.  ?Anticipate labor progression and vaginal delivery.  ? ? ?Noralyn Pick, NP-C, CNM, MSN ?09/24/2021. 4:44 PM  ?

## 2021-09-24 NOTE — Progress Notes (Signed)
Labor Progress Note ? ?Donna Chavez, 31 y.o., G1P0, with an IUP @ [redacted]w[redacted]d, presented for IOL r/t maternal obesity, hsv no lesion on valtrex and new onset GHTN, PCR 0.09, oters labs unremarkable. ? ?Subjective: ?Pt STILL RESTING WELL, STARTING TO FEEL CXT A LITTLE BIT MORE, BUT STILL TOLERATES WELL. Will continue on pitocin. RN to apply The "monica" pods so pt can be mobile and attempt to better pick up cxt. Discussed if no significant change by dinner time will pit break, for pt to eat and shower.  ?Patient Active Problem List  ? Diagnosis Date Noted  ? Encounter for induction of labor 09/24/2021  ? Maternal morbid obesity in third trimester, antepartum ([facility]) 09/23/2021  ? 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 Gestational hypertension w/o significant proteinuria in 3rd trimester 09/23/2021  ? ?Objective: ?BP 127/77   Pulse 87   Temp (!) 97.4 ?F (36.3 ?C) (Axillary)   Resp 18   Ht 5\' 11"  (1.803 m)   Wt 135.5 kg   BMI 41.67 kg/m?  ?No intake/output data recorded. ?No intake/output data recorded. ?NST: FHR baseline 125 bpm, Variability: moderate, Accelerations:present, Decelerations:  Absent= Cat 1/Reactive ?CTX:  2-46mins ?Uterus gravid, soft non tender, moderate to palpate with contractions.  ?SVE:  Dilation: 1 ?Effacement (%): 20 ?Station: Ballotable ?Exam by:: [facility], CNM ?Pitocin at 12 mUn/min ? ?Assessment:  ?Donna Chavez, 31 y.o., G1P0, with an IUP @ [redacted]w[redacted]d, presented for IOL r/t maternal obesity, hsv no lesion on valtrex and new onset GHTN, PCR 0.09, oters labs unremarkable. Pt not progressing with IOL yet. Feeling mild cxt and tolerates well.  ?Patient Active Problem List  ? Diagnosis Date Noted  ? Encounter for induction of labor 09/24/2021  ? Maternal morbid obesity in third trimester, antepartum ([facility]) 09/23/2021  ? 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 Gestational hypertension w/o significant proteinuria in 3rd trimester 09/23/2021  ? ?NICHD: Category 1 ? ?Membranes: Intact, no s/s of infection ? ?Induction:    ?Cytotec x4/13 @ ( 09-19-2005 PO: 0115, 0518, , 1413), ([id number] vaginal: 2135) ?Foley Bulb: Attempted to insert x2 over last 12 hours and unsuccessful last at 0730 4/14.  ? Pitocin - 12 ? ?Pain management:   ?            IV pain management: x PRN ? Nitrous: PRN ?            Epidural placement:  PRN ? ?GBS Negative ? ?GHTN: BP 129/76, asymptomatic, stable, PCR 0.09, other labs unremarkable.  ? ? ?Plan: ?Continue labor plan ?GHTN: monitor BP, repeat labs in the morning, labetalol protocol PRN for Bp >160/110. PO Magnesium gluconate started.  ?Continuous monitoring ?Rest ?Ambulate ?Frequent position changes to facilitate fetal rotation and descent. ?Will reassess with cervical exam at 4 hours or earlier if necessary ?Continue pitocin per protocol ?If no significant change by dinner time will pit break and pt may eat and shower.  ?Anticipate labor progression and vaginal delivery.  ? ? ?5/14, NP-C, CNM, MSN ?09/24/2021. 12:27 PM  ?

## 2021-09-24 NOTE — Progress Notes (Addendum)
Labor Progress Note ? ?Donna Chavez, 31 y.o., G1P0, with an IUP @ [redacted]w[redacted]d, presented for IOL r/t maternal obesity, hsv no lesion on valtrex and new onset GHTN, PCR 0.09, others labs unremarkable. ? ?Subjective: ?Pt feeling increased pressure and cxt, breathing and moaning through them, pt still desires pitocin break, dinner, and shower with nap then will restart pitocin again. Pt denies HA, RUQ pain or vision changes.  ?Patient Active Problem List  ? Diagnosis Date Noted  ? Encounter for induction of labor 09/24/2021  ? Maternal morbid obesity in third trimester, antepartum ([facility]) 09/23/2021  ? 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 Gestational hypertension w/o significant proteinuria in 3rd trimester 09/23/2021  ? ?Objective: ?BP (!) 146/91   Pulse 91   Temp 98.4 ?F (36.9 ?C) (Oral)   Resp 18   Ht 5\' 11"  (1.803 m)   Wt 135.5 kg   BMI 41.67 kg/m?  ?No intake/output data recorded. ?No intake/output data recorded. ?NST: FHR baseline 130 bpm, Variability: moderate, Accelerations:present, Decelerations:  Absent= Cat 1/Reactive ?CTX:  1.5-26mins, lasts 30-50 seconds.  ?Uterus gravid, soft non tender, moderate to palpate with contractions.  ?SVE:  Dilation: 1.5 ?Effacement (%): 40 ?Station: -3 ?Exam by:: [email or web address], CNM ?Pitocin at 20 turned off now at 0 mUn/min ? ?Assessment:  ?Donna Chavez, 31 y.o., G1P0, with an IUP @ [redacted]w[redacted]d, presented for IOL r/t maternal obesity, hsv no lesion on valtrex and new onset GHTN, PCR 0.09, others labs unremarkable. Pt progressing slowly in early labor. Feeling mild cxt and tolerates well.  ?Patient Active Problem List  ? Diagnosis Date Noted  ? Encounter for induction of labor 09/24/2021  ? Maternal morbid obesity in third trimester, antepartum ([facility]) 09/23/2021  ? 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 Gestational hypertension w/o significant proteinuria in 3rd trimester 09/23/2021  ? ?NICHD: Category 1 ? ?Membranes: Intact, no s/s of infection ? ?Induction:   ?Cytotec x4/13 @ ( 09-19-2005 PO: 0115, 0518, , 1413),  ([id number] vaginal: 2135) ?Foley Bulb: Attempted to insert x2 over last 12 hours and unsuccessful last at 0730 4/14.  ? Pitocin - 20 turned off at 2100 4/12 for pit break ? ?Pain management:   ?            IV pain management: x fentanyl @ 1751 on 4/14 ? Nitrous: PRN ?            Epidural placement:  PRN ? ?GBS Negative ? ?GHTN: BP 146/95, asymptomatic, stable, PCR 0.09, other labs unremarkable.  ? ? ?Plan: ?Continue labor plan ?GHTN: monitor BP, repeat labs in the morning, labetalol protocol PRN for Bp >160/110. PO Magnesium gluconate started. May repeat cbc prior to placement for epidural. ?Continuous monitoring ?Rest ?Ambulate ?Frequent position changes to facilitate fetal rotation and descent. ?Will reassess with cervical exam at 4 hours or earlier if necessary ?Restart pitocin per protocol 2x2 after pt eats, showers and take nap.  ?Anticipate labor progression and vaginal delivery.  ? ? ?5/14, NP-C, CNM, MSN ?09/24/2021. 7:10 PM  ?

## 2021-09-24 NOTE — Progress Notes (Signed)
Late entry ?Subjective:   ? ?Doing well and feeling well-rested.  ? ?Objective:   ? ?VS: BP 131/63   Pulse 82   Temp 98.4 ?F (36.9 ?C) (Oral)   Resp 17   Ht 5\' 11"  (1.803 m)   Wt 135.5 kg   BMI 41.67 kg/m?  ?FHR : baseline 135 / variability moderate / accelerations present / absent decelerations ?Toco: occasional ?Membranes: intact ?Dilation: 1.5 ?Effacement (%): 40 ?Station: -3 ?Presentation: Vertex ?Exam by:: [email or web address], CNM ?Pitocin 6 mU/min ? ?Assessment/Plan:  ? ?31 y.o. G1P0 [redacted]w[redacted]d ? ?Labor:  remains in the latent phase S/P Cytotec buccal x3 and oral x2 and low-dose Pitocin ?GHTN:  no signs or symptoms of toxicity, labs stable, and no significant proteinuria ?Fetal Wellbeing:  Category I ?Pain Control:  Labor support without medications ?I/D:   GBS neg ?Anticipated MOD:  NSVD ? ?[redacted]w[redacted]d MSN, CNM ?09/24/2021 9:34 PM ? ?

## 2021-09-24 NOTE — Progress Notes (Signed)
Labor Progress Note ? ?Donna Chavez, 31 y.o., G1P0, with an IUP @ [redacted]w[redacted]d, presented for IOL r/t maternal obesity, hsv no lesion on valtrex and new onset GHTN, PCR 0.09, oters labs unremarkable. ? ?Subjective: ?Pt was resting well, feeling very mild cxt and tolerates well, was able to sleep through them, reviewed R/B/A of foley bulb and pitocin, pt verbalized consent for retrial of foley, bulb, I was unable to advanced foley bulb through second cervical OS, but was able to get my finger through her internal OS with  cook cath, will continue to increase pitocin and reassess in 4 hours.  ?Patient Active Problem List  ? Diagnosis Date Noted  ? Encounter for induction of labor 09/24/2021  ? Maternal morbid obesity in third trimester, antepartum ([facility]) 09/23/2021  ? 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 Gestational hypertension w/o significant proteinuria in 3rd trimester 09/23/2021  ? ?Objective: ?BP 123/73   Pulse 81   Temp 98 ?F (36.7 ?C) (Oral)   Resp 18   Ht 5\' 11"  (1.803 m)   Wt 135.5 kg   BMI 41.67 kg/m?  ?No intake/output data recorded. ?No intake/output data recorded. ?NST: FHR baseline 130 bpm, Variability: moderate, Accelerations:present, Decelerations:  Absent= Cat 1/Reactive ?CTX:  OCC ?Uterus gravid, soft non tender, moderate to palpate with contractions.  ?SVE:  Dilation: 1 ?Effacement (%): 20 ?Station: Ballotable ?Exam by:: [facility], CNM ?Pitocin at 51mUn/min ? ?Assessment:  ?4m, 31 y.o., G1P0, with an IUP @ [redacted]w[redacted]d, presented for IOL r/t maternal obesity, hsv no lesion on valtrex and new onset GHTN, PCR 0.09, oters labs unremarkable.Pt not progressing with IOL yet.  ?Patient Active Problem List  ? Diagnosis Date Noted  ? Encounter for induction of labor 09/24/2021  ? Maternal morbid obesity in third trimester, antepartum ([facility]) 09/23/2021  ? 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 Gestational hypertension w/o significant proteinuria in 3rd trimester 09/23/2021  ? ?NICHD: Category 1 ? ?Membranes: Intact, no s/s  of infection ? ?Induction:   ?Cytotec x4/13 @ ( 09-19-2005 PO: 0115, 0518, , 1413), ([id number] vaginal: 2135) ?Foley Bulb: Attempted to insert x2 over last 12 hours and unsuccessful last at 0730 4/14.  ? Pitocin - 6 ? ?Pain management:   ?            IV pain management: x PRN ? Nitrous: PRN ?            Epidural placement:  PRN ? ?GBS Negative ? ?GHTN: BP 129/76, asymptomatic, stable, PCR 0.09, other labs unremarkable.  ? ? ?Plan: ?Continue labor plan ?GHTN: monitor BP, repeat labs in the morning, labetalol protocol PRN for Bp >160/110. PO Magnesium gluconate started.  ?Continuous monitoring ?Rest ?Ambulate ?Frequent position changes to facilitate fetal rotation and descent. ?Will reassess with cervical exam at 4 hours or earlier if necessary ?Continue pitocin per protocol ?Anticipate labor progression and vaginal delivery.  ? ? ?5/14, NP-C, CNM, MSN ?09/24/2021. 11:03 AM  ?

## 2021-09-25 ENCOUNTER — Encounter (HOSPITAL_COMMUNITY): Payer: Self-pay | Admitting: Obstetrics and Gynecology

## 2021-09-25 ENCOUNTER — Inpatient Hospital Stay (HOSPITAL_COMMUNITY): Payer: BC Managed Care – PPO | Admitting: Anesthesiology

## 2021-09-25 LAB — CBC WITH DIFFERENTIAL/PLATELET
Abs Immature Granulocytes: 0.08 10*3/uL — ABNORMAL HIGH (ref 0.00–0.07)
Basophils Absolute: 0 10*3/uL (ref 0.0–0.1)
Basophils Relative: 0 %
Eosinophils Absolute: 0.1 10*3/uL (ref 0.0–0.5)
Eosinophils Relative: 1 %
HCT: 33.4 % — ABNORMAL LOW (ref 36.0–46.0)
Hemoglobin: 11.3 g/dL — ABNORMAL LOW (ref 12.0–15.0)
Immature Granulocytes: 1 %
Lymphocytes Relative: 10 %
Lymphs Abs: 1.4 10*3/uL (ref 0.7–4.0)
MCH: 29.7 pg (ref 26.0–34.0)
MCHC: 33.8 g/dL (ref 30.0–36.0)
MCV: 87.9 fL (ref 80.0–100.0)
Monocytes Absolute: 1.1 10*3/uL — ABNORMAL HIGH (ref 0.1–1.0)
Monocytes Relative: 8 %
Neutro Abs: 11.7 10*3/uL — ABNORMAL HIGH (ref 1.7–7.7)
Neutrophils Relative %: 80 %
Platelets: 238 10*3/uL (ref 150–400)
RBC: 3.8 MIL/uL — ABNORMAL LOW (ref 3.87–5.11)
RDW: 15 % (ref 11.5–15.5)
WBC: 14.4 10*3/uL — ABNORMAL HIGH (ref 4.0–10.5)
nRBC: 0 % (ref 0.0–0.2)

## 2021-09-25 MED ORDER — SIMETHICONE 80 MG PO CHEW: 80.0000 mg | CHEWABLE_TABLET | ORAL | Status: DC | PRN

## 2021-09-25 MED ORDER — OXYCODONE HCL 5 MG PO TABS: 10.0000 mg | ORAL_TABLET | ORAL | Status: DC | PRN

## 2021-09-25 MED ORDER — FENTANYL-BUPIVACAINE-NACL 0.5-0.125-0.9 MG/250ML-% EP SOLN
EPIDURAL | Status: DC | PRN
  Administered 2021-09-25: 12 mL/h via EPIDURAL

## 2021-09-25 MED ORDER — DIPHENHYDRAMINE HCL 25 MG PO CAPS: 25.0000 mg | ORAL_CAPSULE | Freq: Four times a day (QID) | ORAL | Status: DC | PRN

## 2021-09-25 MED ORDER — IBUPROFEN 600 MG PO TABS
600.0000 mg | ORAL_TABLET | Freq: Four times a day (QID) | ORAL | Status: DC
  Administered 2021-09-25 – 2021-09-27 (×7): 600 mg via ORAL
  Filled 2021-09-25 (×7): qty 1

## 2021-09-25 MED ORDER — DIBUCAINE (PERIANAL) 1 % EX OINT: 1.0000 "application " | TOPICAL_OINTMENT | CUTANEOUS | Status: DC | PRN

## 2021-09-25 MED ORDER — BENZOCAINE-MENTHOL 20-0.5 % EX AERO
1.0000 "application " | INHALATION_SPRAY | CUTANEOUS | Status: DC | PRN
  Administered 2021-09-25: 1 via TOPICAL
  Filled 2021-09-25: qty 56

## 2021-09-25 MED ORDER — ZOLPIDEM TARTRATE 5 MG PO TABS: 5.0000 mg | ORAL_TABLET | Freq: Every evening | ORAL | Status: DC | PRN

## 2021-09-25 MED ORDER — OXYCODONE HCL 5 MG PO TABS: 5.0000 mg | ORAL_TABLET | ORAL | Status: DC | PRN

## 2021-09-25 MED ORDER — TETANUS-DIPHTH-ACELL PERTUSSIS 5-2.5-18.5 LF-MCG/0.5 IM SUSY: 0.5000 mL | PREFILLED_SYRINGE | Freq: Once | INTRAMUSCULAR | Status: DC

## 2021-09-25 MED ORDER — COCONUT OIL OIL: 1.0000 "application " | TOPICAL_OIL | Status: DC | PRN

## 2021-09-25 MED ORDER — SENNOSIDES-DOCUSATE SODIUM 8.6-50 MG PO TABS
2.0000 | ORAL_TABLET | Freq: Every day | ORAL | Status: DC
  Administered 2021-09-26: 2 via ORAL
  Filled 2021-09-25 (×2): qty 2

## 2021-09-25 MED ORDER — ONDANSETRON HCL 4 MG/2ML IJ SOLN
4.0000 mg | INTRAMUSCULAR | Status: DC | PRN
Start: 2021-09-25 — End: 2021-09-27

## 2021-09-25 MED ORDER — PRENATAL MULTIVITAMIN CH
1.0000 | ORAL_TABLET | Freq: Every day | ORAL | Status: DC
  Administered 2021-09-26 – 2021-09-27 (×2): 1 via ORAL
  Filled 2021-09-25 (×2): qty 1

## 2021-09-25 MED ORDER — NIFEDIPINE ER OSMOTIC RELEASE 30 MG PO TB24
30.0000 mg | ORAL_TABLET | Freq: Every day | ORAL | Status: DC
  Administered 2021-09-25 – 2021-09-27 (×3): 30 mg via ORAL
  Filled 2021-09-25 (×3): qty 1

## 2021-09-25 MED ORDER — ACETAMINOPHEN 325 MG PO TABS: 650.0000 mg | ORAL_TABLET | ORAL | Status: DC | PRN

## 2021-09-25 MED ORDER — ONDANSETRON HCL 4 MG PO TABS: 4.0000 mg | ORAL_TABLET | ORAL | Status: DC | PRN

## 2021-09-25 MED ORDER — WITCH HAZEL-GLYCERIN EX PADS: 1.0000 "application " | MEDICATED_PAD | CUTANEOUS | Status: DC | PRN

## 2021-09-25 MED ORDER — TRANEXAMIC ACID-NACL 1000-0.7 MG/100ML-% IV SOLN: 1000.0000 mg | INTRAVENOUS | Status: DC

## 2021-09-25 MED ORDER — TRANEXAMIC ACID-NACL 1000-0.7 MG/100ML-% IV SOLN
1000.0000 mg | INTRAVENOUS | Status: AC
  Administered 2021-09-25: 1000 mg via INTRAVENOUS

## 2021-09-25 MED ORDER — LIDOCAINE HCL (PF) 1 % IJ SOLN
INTRAMUSCULAR | Status: DC | PRN
  Administered 2021-09-25: 5 mL via EPIDURAL

## 2021-09-25 NOTE — Progress Notes (Addendum)
Labor Progress Note ? ?Donna Chavez, 31 y.o., G1P0, with an IUP @ [redacted]w[redacted]d, presented for IOL r/t maternal obesity, hsv no lesion on valtrex and new onset GHTN, PCR 0.09, others labs unremarkable. ? ?Subjective: ?Pt sleeping and comfortable with epidural. Discussed R/B/A about AROM and IUPC placement. Pt verbalized consent for placement of both.  ?Patient Active Problem List  ? Diagnosis Date Noted  ? Encounter for induction of labor 09/24/2021  ? Maternal morbid obesity in third trimester, antepartum (HCC) 09/23/2021  ? 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 Gestational hypertension w/o significant proteinuria in 3rd trimester 09/23/2021  ? ?Objective: ?BP (!) 146/87   Pulse 86   Temp 98.1 ?F (36.7 ?C) (Oral)   Resp 16   Ht 5\' 11"  (1.803 m)   Wt 135.5 kg   SpO2 97%   BMI 41.67 kg/m?  ?I/O last 3 completed shifts: ?In: 6918.3 [P.O.:540; I.V.:6306.3; Other:72] ?Out: 400 [Urine:100; Emesis/NG output:300] ?Total I/O ?In: -  ?Out: 300 [Urine:300] ?NST: FHR baseline 130 bpm, Variability: moderate, Accelerations:present, Decelerations:  Absent= Cat 1/Reactive ?CTX: 2-4 mins, lasts 60-80 seconds. On the pods, "monica", cxt slightly hard to trace. ?Uterus gravid, soft non tender, moderate to palpate with contractions.  ?SVE:  Dilation: 3 ?Effacement (%): 70 ?Station: -2 ?Exam by:: [email or web address], RN @ 0500 ?Pitocin at 20 mUn/min ?AROM, moderate meconium ?IUPC placed with ease tolerated well.  ? ?Assessment:  ?Donna Chavez, 31 y.o., G1P0, with an IUP @ [redacted]w[redacted]d, presented for IOL r/t maternal obesity, hsv no lesion on valtrex and new onset GHTN, PCR 0.09, others labs unremarkable. Pt progressing slowly in latent labor. Comfortable with epidural  ?Patient Active Problem List  ? Diagnosis Date Noted  ? Encounter for induction of labor 09/24/2021  ? Maternal morbid obesity in third trimester, antepartum (HCC) 09/23/2021  ? 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 Gestational hypertension w/o significant proteinuria in 3rd trimester 09/23/2021   ? ?NICHD: Category 1 ? ?Membranes: AROM, mod mec @ 0831 on 4/15 no s/s of infection ? ?Induction:   ?Cytotec x4/13 @ ( 09-19-2005 PO: 0115, 0518, , 1413), ([id number] vaginal: 2135) ?Foley Bulb: Attempted to insert x2 over last 12 hours and unsuccessful last at 0730 4/14.  ?Pitocin - 20 turned off at 2100 4/12 for pit break, restarted at 2 on 4/14 @ 2200, currently on 20 ? ?Pain management:   ?            IV pain management: x fentanyl @ 1751, 1954 on 4/14 ? Nitrous: PRN ?            Epidural placement:  Placed at 0017 on 4/15 ? ?GBS Negative ? ?GHTN: BP 155/96, asymptomatic, stable, PCR 0.09, other labs unremarkable.  ? ? ?Plan: ?Continue labor plan ?GHTN: monitor BP, repeat labs in the morning, labetalol protocol PRN for Bp >160/110. PO Magnesium gluconate started. May repeat cbc prior to placement for epidural. ?Continuous monitoring ?Rest ?Ambulate ?Frequent position changes to facilitate fetal rotation and descent. ?Will reassess with cervical exam at 4 hours or earlier if necessary ?Continue pitocin per protocol 2x2 to MVU 200-250 ?Anticipate labor progression and vaginal delivery.  ? ? ?5/15, NP-C, CNM, MSN ?09/25/2021. 9:32 AM ?

## 2021-09-25 NOTE — Anesthesia Preprocedure Evaluation (Signed)
Anesthesia Evaluation  ?Patient identified by MRN, date of birth, ID band ?Patient awake ? ? ? ?Reviewed: ?Allergy & Precautions, NPO status , Patient's Chart, lab work & pertinent test results ? ?Airway ?Mallampati: III ? ?TM Distance: >3 FB ?Neck ROM: Full ? ? ? Dental ?no notable dental hx. ?(+) Teeth Intact, Dental Advisory Given ?  ?Pulmonary ?neg pulmonary ROS, former smoker,  ?  ?Pulmonary exam normal ?breath sounds clear to auscultation ? ? ? ? ? ? Cardiovascular ?hypertension, Pt. on medications ?Normal cardiovascular exam ?Rhythm:Regular Rate:Normal ? ? ?  ?Neuro/Psych ?negative neurological ROS ?   ? GI/Hepatic ?negative GI ROS, Neg liver ROS,   ?Endo/Other  ?negative endocrine ROS ? Renal/GU ?negative Renal ROS  ? ?  ?Musculoskeletal ? ? Abdominal ?(+) + obese (BMI 41.67),   ?Peds ? Hematology ?Lab Results ?     Component                Value               Date                 ?     WBC                      8.3                 09/24/2021           ?     HGB                      10.2 (L)            09/24/2021           ?     HCT                      29.2 (L)            09/24/2021           ?     MCV                      87.7                09/24/2021           ?     PLT                      209                 09/24/2021           ?   ?Anesthesia Other Findings ? ? Reproductive/Obstetrics ?(+) Pregnancy ? ?  ? ? ? ? ? ? ? ? ? ? ? ? ? ?  ?  ? ? ? ? ? ? ? ?Anesthesia Physical ?Anesthesia Plan ? ?ASA: 3 ? ?Anesthesia Plan: Epidural  ? ?Post-op Pain Management:   ? ?Induction:  ? ?PONV Risk Score and Plan: Treatment may vary due to age or medical condition ? ?Airway Management Planned:  ? ?Additional Equipment:  ? ?Intra-op Plan:  ? ?Post-operative Plan:  ? ?Informed Consent: I have reviewed the patients History and Physical, chart, labs and discussed the procedure including the risks, benefits and alternatives for the proposed anesthesia with the patient or authorized  representative who has indicated his/her understanding and acceptance.  ? ? ? ? ? ?  Plan Discussed with:  ? ?Anesthesia Plan Comments: (40.2 wk primagravida w GHTn and BMI of 41.7 for LEA)  ? ? ? ? ? ? ?Anesthesia Quick Evaluation ? ?

## 2021-09-25 NOTE — Anesthesia Procedure Notes (Signed)
Epidural ?Patient location during procedure: OB ?Start time: 09/25/2021 12:33 AM ?End time: 09/25/2021 12:46 AM ? ?Staffing ?Anesthesiologist: Trevor Iha, MD ?Performed: anesthesiologist  ? ?Preanesthetic Checklist ?Completed: patient identified, IV checked, site marked, risks and benefits discussed, surgical consent, monitors and equipment checked, pre-op evaluation and timeout performed ? ?Epidural ?Patient position: sitting ?Prep: DuraPrep and site prepped and draped ?Patient monitoring: continuous pulse ox and blood pressure ?Approach: midline ?Location: L3-L4 ?Injection technique: LOR air ? ?Needle:  ?Needle type: Tuohy  ?Needle gauge: 17 G ?Needle length: 9 cm and 9 ?Needle insertion depth: 9 cm ?Catheter type: closed end flexible ?Catheter size: 19 Gauge ?Catheter at skin depth: 15 cm ?Test dose: negative ? ?Assessment ?Events: blood not aspirated, injection not painful, no injection resistance, no paresthesia and negative IV test ? ?Additional Notes ?Patient identified. Risks/Benefits/Options discussed with patient including but not limited to bleeding, infection, nerve damage, paralysis, failed block, incomplete pain control, headache, blood pressure changes, nausea, vomiting, reactions to medication both or allergic, itching and postpartum back pain. Confirmed with bedside nurse the patient's most recent platelet count. Confirmed with patient that they are not currently taking any anticoagulation, have any bleeding history or any family history of bleeding disorders. Patient expressed understanding and wished to proceed. All questions were answered. Sterile technique was used throughout the entire procedure. Please see nursing notes for vital signs. Test dose was given through epidural needle and negative prior to continuing to dose epidural or start infusion. Warning signs of high block given to the patient including shortness of breath, tingling/numbness in hands, complete motor block, or any  concerning symptoms with instructions to call for help. Patient was given instructions on fall risk and not to get out of bed. All questions and concerns addressed with instructions to call with any issues.  1Attempt (S) . Patient tolerated procedure well. ? ? ? ?

## 2021-09-25 NOTE — Progress Notes (Addendum)
Labor Progress Note ? ?Dortha Kern, 31 y.o., G1P0, with an IUP @ [redacted]w[redacted]d, presented for IOL r/t maternal obesity, hsv no lesion on valtrex and new onset GHTN, PCR 0.09, others labs unremarkable. ? ?Subjective: ?Pt feeling lots of pressure in rectum, 6cm dilated now, epidural button pushed, pt remains on peanut ball.  ?Patient Active Problem List  ? Diagnosis Date Noted  ? Encounter for induction of labor 09/24/2021  ? Maternal morbid obesity in third trimester, antepartum (HCC) 09/23/2021  ? 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 Gestational hypertension w/o significant proteinuria in 3rd trimester 09/23/2021  ? ?Objective: ?BP 135/82   Pulse 93   Temp 98.1 ?F (36.7 ?C) (Oral)   Resp 16   Ht 5\' 11"  (1.803 m)   Wt 135.5 kg   SpO2 97%   BMI 41.67 kg/m?  ?I/O last 3 completed shifts: ?In: 6918.3 [P.O.:540; I.V.:6306.3; Other:72] ?Out: 400 [Urine:100; Emesis/NG output:300] ?Total I/O ?In: -  ?Out: 300 [Urine:300] ?NST: FHR baseline 140 bpm, Variability: moderate, Accelerations:present, Decelerations:  Absent= Cat 1/Reactive ?CTX: 3-4 mins, lasts 60-90 seconds.  ?Uterus gravid, soft non tender, moderate to palpate with contractions.  ?SVE:  Dilation: 6 ?Effacement (%): 90 ?Station: 0 ?Exam by:: [facility], CNM ?Pitocin at 30 mUn/min ?IUPC still in place ?MVU 170-200 ?Bloody show noted on glove.  ? ?Assessment:  ?[name], 31 y.o., G1P0, with an IUP @ [redacted]w[redacted]d, presented for IOL r/t maternal obesity, hsv no lesion on valtrex and new onset GHTN, PCR 0.09, others labs unremarkable. Pt progressing active labor. Comfortable with epidural  ?Patient Active Problem List  ? Diagnosis Date Noted  ? Encounter for induction of labor 09/24/2021  ? Maternal morbid obesity in third trimester, antepartum (HCC) 09/23/2021  ? 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 Gestational hypertension w/o significant proteinuria in 3rd trimester 09/23/2021  ? ?NICHD: Category 1 ? ?Membranes: AROM, mod mec @ 0831 on 4/15 no s/s of infection ? ?Induction:    ?Cytotec x4/13 @ ( 09-19-2005 PO: 0115, 0518, , 1413), ([id number] vaginal: 2135) ?Foley Bulb: Attempted to insert x2 over last 12 hours and unsuccessful last at 0730 4/14.  ?Pitocin - 20 turned off at 2100 4/12 for pit break, restarted at 2 on 4/14 @ 2200, currently on 30 ? ?Pain management:   ?            IV pain management: x fentanyl @ 1751, 1954 on 4/14 ? Nitrous: PRN ?            Epidural placement:  Placed at 0017 on 4/15 ? ?GBS Negative ? ?GHTN: BP 135/82, asymptomatic, stable, PCR 0.09, other labs unremarkable.  ? ? ?Plan: ?Continue labor plan ?GHTN: monitor BP, repeat labs in the morning, labetalol protocol PRN for Bp >160/110. PO Magnesium gluconate started. May repeat cbc prior to placement for epidural. ?Continuous monitoring ?Rest ?Frequent position changes to facilitate fetal rotation and descent. ?Will reassess with cervical exam at 4 hours or earlier if necessary ?Continue pitocin per protocol 2x2 to MVU 200-250 ?Anticipate labor progression and vaginal delivery.  ? ? ?5/15, NP-C, CNM, MSN ?09/25/2021. 3:26 PM ?MD Addendum: ?I reviewed chart and agree with above findings, assessment and plan as outlined above by [facility], NP-C, CNM, MSN.  ?Dr. [name]. ?09/25/2021. 3:36 PM.   ?

## 2021-09-25 NOTE — Progress Notes (Signed)
Labor Progress Note ? ?Donna Chavez, 32 y.o., G1P0, with an IUP @ [redacted]w[redacted]d, presented for IOL r/t maternal obesity, hsv no lesion on valtrex and new onset GHTN, PCR 0.09, others labs unremarkable. ? ?Subjective: ?Pt sleeping and comfortable with epidural.  ?Patient Active Problem List  ? Diagnosis Date Noted  ? Encounter for induction of labor 09/24/2021  ? Maternal morbid obesity in third trimester, antepartum (HCC) 09/23/2021  ? 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 Gestational hypertension w/o significant proteinuria in 3rd trimester 09/23/2021  ? ?Objective: ?BP 121/76   Pulse 94   Temp 97.6 ?F (36.4 ?C) (Axillary)   Resp 16   Ht 5\' 11"  (1.803 m)   Wt 135.5 kg   SpO2 97%   BMI 41.67 kg/m?  ?No intake/output data recorded. ?Total I/O ?In: 6306.2 [P.O.:240; I.V.:6018.2; Other:48] ?Out: 400 [Urine:100; Emesis/NG output:300] ?NST: FHR baseline 130 bpm, Variability: moderate, Accelerations:present, Decelerations:  Absent= Cat 1/Reactive ?CTX:  1.5-54mins, lasts 30-50 seconds. On the pods, "monica", cxt slightly hard to trace. ?Uterus gravid, soft non tender, moderate to palpate with contractions.  ?SVE:  Dilation: 3.5 ?Effacement (%): 60 ?Station: -2 ?Exam by:: [email or web address], RN @ 0500 ?Pitocin at 16 mUn/min ? ?Assessment:  ?Donna Chavez, 31 y.o., G1P0, with an IUP @ [redacted]w[redacted]d, presented for IOL r/t maternal obesity, hsv no lesion on valtrex and new onset GHTN, PCR 0.09, others labs unremarkable. Pt progressing slowly in latent labor. Feeling mild cxt and tolerates well.  ?Patient Active Problem List  ? Diagnosis Date Noted  ? Encounter for induction of labor 09/24/2021  ? Maternal morbid obesity in third trimester, antepartum (HCC) 09/23/2021  ? 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 Gestational hypertension w/o significant proteinuria in 3rd trimester 09/23/2021  ? ?NICHD: Category 1 ? ?Membranes: Intact, no s/s of infection ? ?Induction:   ?Cytotec x4/13 @ ( 09-19-2005 PO: 0115, 0518, , 1413), ([id number] vaginal: 2135) ?Foley Bulb:  Attempted to insert x2 over last 12 hours and unsuccessful last at 0730 4/14.  ?Pitocin - 20 turned off at 2100 4/12 for pit break, restarted at 2 on 4/14 @ 2200, currently on 16 ? ?Pain management:   ?            IV pain management: x fentanyl @ 1751, 1954 on 4/14 ? Nitrous: PRN ?            Epidural placement:  Placed at 0017 on 4/15 ? ?GBS Negative ? ?GHTN: BP 136/86, asymptomatic, stable, PCR 0.09, other labs unremarkable.  ? ? ?Plan: ?Continue labor plan ?GHTN: monitor BP, repeat labs in the morning, labetalol protocol PRN for Bp >160/110. PO Magnesium gluconate started. May repeat cbc prior to placement for epidural. ?Continuous monitoring ?Rest ?Ambulate ?Frequent position changes to facilitate fetal rotation and descent. ?Will reassess with cervical exam at 4 hours or earlier if necessary ?Continue pitocin per protocol 2x2  ?Anticipate AROM and IUPC placement r/t unable to fully trace cxt.  ?Anticipate labor progression and vaginal delivery.  ? ? ?5/15, NP-C, CNM, MSN ?09/25/2021. 04:51 AM  ?

## 2021-09-25 NOTE — Progress Notes (Signed)
Labor Progress Note ? ?Donna Chavez, 31 y.o., G1P0, with an IUP @ [redacted]w[redacted]d, presented for IOL r/t maternal obesity, hsv no lesion on valtrex and new onset GHTN, PCR 0.09, others labs unremarkable. ? ?Subjective: ?Pt still comfortable with epidural, RN using maternal position change every 30 mins, continue to titrate pit according to MVU ?Patient Active Problem List  ? Diagnosis Date Noted  ? Encounter for induction of labor 09/24/2021  ? Maternal morbid obesity in third trimester, antepartum (HCC) 09/23/2021  ? 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 Gestational hypertension w/o significant proteinuria in 3rd trimester 09/23/2021  ? ?Objective: ?BP (!) 138/56   Pulse (!) 109   Temp 98.1 ?F (36.7 ?C) (Oral)   Resp 16   Ht 5\' 11"  (1.803 m)   Wt 135.5 kg   SpO2 97%   BMI 41.67 kg/m?  ?I/O last 3 completed shifts: ?In: 6918.3 [P.O.:540; I.V.:6306.3; Other:72] ?Out: 400 [Urine:100; Emesis/NG output:300] ?Total I/O ?In: -  ?Out: 300 [Urine:300] ?NST: FHR baseline 130 bpm, Variability: moderate, Accelerations:present, Decelerations:  Absent= Cat 1/Reactive ?CTX: 2-4 mins, lasts 60-80 seconds.  ?Uterus gravid, soft non tender, moderate to palpate with contractions.  ?SVE:  Dilation: 4 ?Effacement (%): 70 ?Station: -2 ?Exam by:: [email or web address], RN @ 0500 ?Pitocin at 24 mUn/min ?IUPC still in place ?MVU 170-190 ?Bloody show noted on glove.  ? ?Assessment:  ?Donna Chavez, 31 y.o., G1P0, with an IUP @ [redacted]w[redacted]d, presented for IOL r/t maternal obesity, hsv no lesion on valtrex and new onset GHTN, PCR 0.09, others labs unremarkable. Pt progressing slowly in latent labor. Comfortable with epidural  ?Patient Active Problem List  ? Diagnosis Date Noted  ? Encounter for induction of labor 09/24/2021  ? Maternal morbid obesity in third trimester, antepartum (HCC) 09/23/2021  ? 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 Gestational hypertension w/o significant proteinuria in 3rd trimester 09/23/2021  ? ?NICHD: Category 1 ? ?Membranes: AROM, mod mec @ 0831 on  4/15 no s/s of infection ? ?Induction:   ?Cytotec x4/13 @ ( 09-19-2005 PO: 0115, 0518, , 1413), ([id number] vaginal: 2135) ?Foley Bulb: Attempted to insert x2 over last 12 hours and unsuccessful last at 0730 4/14.  ?Pitocin - 20 turned off at 2100 4/12 for pit break, restarted at 2 on 4/14 @ 2200, currently on 24 ? ?Pain management:   ?            IV pain management: x fentanyl @ 1751, 1954 on 4/14 ? Nitrous: PRN ?            Epidural placement:  Placed at 0017 on 4/15 ? ?GBS Negative ? ?GHTN: BP 138/56, asymptomatic, stable, PCR 0.09, other labs unremarkable.  ? ? ?Plan: ?Continue labor plan ?GHTN: monitor BP, repeat labs in the morning, labetalol protocol PRN for Bp >160/110. PO Magnesium gluconate started. May repeat cbc prior to placement for epidural. ?Continuous monitoring ?Rest ?Frequent position changes to facilitate fetal rotation and descent. ?Will reassess with cervical exam at 4 hours or earlier if necessary ?Continue pitocin per protocol 2x2 to MVU 200-250 ?Anticipate labor progression and vaginal delivery.  ? ? ?5/15, NP-C, CNM, MSN ?09/25/2021. 12:11 PM ?

## 2021-09-25 NOTE — Progress Notes (Signed)
Labor Progress Note ? ?Donna Chavez, 31 y.o., G1P0, with an IUP @ [redacted]w[redacted]d, presented for IOL r/t maternal obesity, hsv no lesion on valtrex and new onset GHTN, PCR 0.09, others labs unremarkable. ? ?Subjective: ?Pt continues on pitocin, pt feeling moderate cxts, plan for epidural. ?Patient Active Problem List  ? Diagnosis Date Noted  ? Encounter for induction of labor 09/24/2021  ? Maternal morbid obesity in third trimester, antepartum (HCC) 09/23/2021  ? 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 Gestational hypertension w/o significant proteinuria in 3rd trimester 09/23/2021  ? ?Objective: ?BP 121/76   Pulse 94   Temp 97.6 ?F (36.4 ?C) (Axillary)   Resp 16   Ht 5\' 11"  (1.803 m)   Wt 135.5 kg   SpO2 97%   BMI 41.67 kg/m?  ?No intake/output data recorded. ?Total I/O ?In: 6306.2 [P.O.:240; I.V.:6018.2; Other:48] ?Out: 400 [Urine:100; Emesis/NG output:300] ?NST: FHR baseline 130 bpm, Variability: moderate, Accelerations:present, Decelerations:  Absent= Cat 1/Reactive ?CTX:  1.5-75mins, lasts 30-50 seconds.  ?Uterus gravid, soft non tender, moderate to palpate with contractions.  ?SVE:  Dilation: 2 ?Effacement (%): 60 ?Station: -2 ?Exam by:: [email or web address], RN ?Pitocin at 8 mUn/min ? ?Assessment:  ?Donna Chavez, 31 y.o., G1P0, with an IUP @ [redacted]w[redacted]d, presented for IOL r/t maternal obesity, hsv no lesion on valtrex and new onset GHTN, PCR 0.09, others labs unremarkable. Pt progressing slowly in latent labor. Feeling mild cxt and tolerates well.  ?Patient Active Problem List  ? Diagnosis Date Noted  ? Encounter for induction of labor 09/24/2021  ? Maternal morbid obesity in third trimester, antepartum (HCC) 09/23/2021  ? 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 Gestational hypertension w/o significant proteinuria in 3rd trimester 09/23/2021  ? ?NICHD: Category 1 ? ?Membranes: Intact, no s/s of infection ? ?Induction:   ?Cytotec x4/13 @ ( 09-19-2005 PO: 0115, 0518, , 1413), ([id number] vaginal: 2135) ?Foley Bulb: Attempted to insert x2 over last 12  hours and unsuccessful last at 0730 4/14.  ?Pitocin - 20 turned off at 2100 4/12 for pit break, restarted at 2 on 4/14 @ 2200, currently on 8 ? ?Pain management:   ?            IV pain management: x fentanyl @ 1751, 1954 on 4/14 ? Nitrous: PRN ?            Epidural placement:  Plans to get soon ? ?GBS Negative ? ?GHTN: BP 143/72, asymptomatic, stable, PCR 0.09, other labs unremarkable.  ? ? ?Plan: ?Continue labor plan ?GHTN: monitor BP, repeat labs in the morning, labetalol protocol PRN for Bp >160/110. PO Magnesium gluconate started. May repeat cbc prior to placement for epidural. ?Continuous monitoring ?Rest ?Ambulate ?Frequent position changes to facilitate fetal rotation and descent. ?Will reassess with cervical exam at 4 hours or earlier if necessary ?Continue pitocin per protocol 2x2  ?Anticipate labor progression and vaginal delivery.  ? ? ?5/14, NP-C, CNM, MSN ?09/25/2021. 00:51 AM  ?

## 2021-09-25 NOTE — Lactation Note (Signed)
This note was copied from a baby's chart. ?Lactation Consultation Note ? ?Patient Name: Donna Chavez ?Today's Date: 09/25/2021 ?Reason for consult: L&D Initial assessment;Mother's request;Difficult latch;Primapara;1st time breastfeeding;Term;Breastfeeding assistance;Nipple pain/trauma ?Age:33 hours ? ?LC assisted with latching infant in cradle on right breast with signs of milk transfer. Pain reduced from 4 to 0 as we worked to get more depth. Infant latched prior to [facility] arrival and Mom had some nipple pain.  ? ?Mom to receive further LC support once on the floor.  ? ? ? ?Maternal Data ?Does the patient have breastfeeding experience prior to this delivery?: No ? ?Feeding ?Mother's Current Feeding Choice: Breast Milk ? ?LATCH Score ?Latch: Repeated attempts needed to sustain latch, nipple held in mouth throughout feeding, stimulation needed to elicit sucking reflex. ? ?Audible Swallowing: Spontaneous and intermittent ? ?Type of Nipple: Everted at rest and after stimulation ? ?Comfort (Breast/Nipple): Filling, red/small blisters or bruises, mild/mod discomfort ? ?Hold (Positioning): Assistance needed to correctly position infant at breast and maintain latch. ? ?LATCH Score: 7 ? ? ?Lactation Tools Discussed/Used ?  ? ?Interventions ?Interventions: Breast feeding basics reviewed;Assisted with latch;Skin to skin;Breast massage;Hand express;Breast compression;Adjust position;Support pillows;Position options;Expressed milk;Education;Infant Driven Feeding Algorithm education ? ?Discharge ?  ? ?Consult Status ?Consult Status: Follow-up from L&D ?Date: 09/26/21 ?Follow-up type: In-patient ? ? ? ?[name] ?09/25/2021, 8:26 PM ? ? ? ?

## 2021-09-26 LAB — CBC
HCT: 30 % — ABNORMAL LOW (ref 36.0–46.0)
Hemoglobin: 10.1 g/dL — ABNORMAL LOW (ref 12.0–15.0)
MCH: 29.7 pg (ref 26.0–34.0)
MCHC: 33.7 g/dL (ref 30.0–36.0)
MCV: 88.2 fL (ref 80.0–100.0)
Platelets: 214 K/uL (ref 150–400)
RBC: 3.4 MIL/uL — ABNORMAL LOW (ref 3.87–5.11)
RDW: 15 % (ref 11.5–15.5)
WBC: 13.8 K/uL — ABNORMAL HIGH (ref 4.0–10.5)
nRBC: 0 % (ref 0.0–0.2)

## 2021-09-26 LAB — COMPREHENSIVE METABOLIC PANEL
ALT: 12 U/L (ref 0–44)
AST: 15 U/L (ref 15–41)
Albumin: 2.2 g/dL — ABNORMAL LOW (ref 3.5–5.0)
Alkaline Phosphatase: 142 U/L — ABNORMAL HIGH (ref 38–126)
Anion gap: 5 (ref 5–15)
BUN: <5 mg/dL — ABNORMAL LOW (ref 6–20)
CO2: 20 mmol/L — ABNORMAL LOW (ref 22–32)
Calcium: 8.1 mg/dL — ABNORMAL LOW (ref 8.9–10.3)
Chloride: 109 mmol/L (ref 98–111)
Creatinine, Ser: 0.58 mg/dL (ref 0.44–1.00)
GFR, Estimated: >60 mL/min (ref >60)
Glucose, Bld: 68 mg/dL — ABNORMAL LOW (ref 70–99)
Potassium: 3.9 mmol/L (ref 3.5–5.1)
Sodium: 134 mmol/L — ABNORMAL LOW (ref 135–145)
Total Bilirubin: 0.3 mg/dL (ref 0.3–1.2)
Total Protein: 4.8 g/dL — ABNORMAL LOW (ref 6.5–8.1)

## 2021-09-26 NOTE — Lactation Note (Signed)
This note was copied from a baby's chart. ?Lactation Consultation Note ? ?Patient Name: Donna Chavez ?Today's Date: 09/26/2021 ?Reason for consult: Initial assessment;Term;Primapara;1st time breastfeeding ?Age:33 hours ? ?LC in to room for initial consult. Mother states infant has been latching well, but she is experiencing nipple discomfort. LC reviewed hand expression and colostrum is easily noted. Assisted with latch, and talked about positioning, alignment, neck/back support. Demonstrated chin tug to improve latch when baby transitions into a shallow latch.  ?Reinforced using own breast milk for nipple care followed by nipple balm or coconut oil.  ?Reviewed normal newborn behavior during first 24h, expected output and feeding frequency.  ? ?Plan: ?1-Skin to skin, aim for a deep, comfortable latch and breastfeed on demand or 8-12 times in 24h period. ?2-Encouraged maternal rest, hydration and food intake.  ?3-Contact LC as needed for feeds/support/concerns/questions ?  ?All questions answered at this time. Provided Lactation services brochure and promoted [employer] booklet information.  ? ? ?Maternal Data ?Has patient been taught Hand Expression?: Yes ?Does the patient have breastfeeding experience prior to this delivery?: No ? ?Feeding ?Mother's Current Feeding Choice: Breast Milk ? ?LATCH Score ?Latch: Grasps breast easily, tongue down, lips flanged, rhythmical sucking. ? ?Audible Swallowing: Spontaneous and intermittent ? ?Type of Nipple: Everted at rest and after stimulation ? ?Comfort (Breast/Nipple): Soft / non-tender ? ?Hold (Positioning): Assistance needed to correctly position infant at breast and maintain latch. ? ?LATCH Score: 9 ? ?Interventions ?Interventions: Breast feeding basics reviewed;Assisted with latch;Skin to skin;Breast massage;Hand express;Breast compression;Expressed milk;LC Services brochure;Education ? ?Discharge ?Discharge Education: Engorgement and breast care ?Pump:  Personal ? ?Consult Status ?Consult Status: Follow-up ?Date: 09/27/21 ?Follow-up type: In-patient ? ? ? ?Tywon Niday A Higuera Ancidey ?09/26/2021, 8:54 PM ? ? ? ?

## 2021-09-26 NOTE — Anesthesia Postprocedure Evaluation (Signed)
Anesthesia Post Note ? ?Patient: Davona Arbelaez ? ?Procedure(s) Performed: AN AD HOC LABOR EPIDURAL ? ?  ? ?Patient location during evaluation: Mother Baby ?Anesthesia Type: Epidural ?Level of consciousness: awake and alert ?Pain management: pain level controlled ?Vital Signs Assessment: post-procedure vital signs reviewed and stable ?Respiratory status: spontaneous breathing, nonlabored ventilation and respiratory function stable ?Cardiovascular status: stable ?Postop Assessment: no headache, no backache, epidural receding, no apparent nausea or vomiting, patient able to bend at knees, adequate PO intake and able to ambulate ?Anesthetic complications: no ? ? ?No notable events documented. ? ?Last Vitals:  ?Vitals:  ? 09/26/21 0405 09/26/21 0700  ?BP: 118/83 128/78  ?Pulse: 81 80  ?Resp:    ?Temp:    ?SpO2:    ?  ?Last Pain:  ?Vitals:  ? 09/26/21 0700  ?TempSrc:   ?PainSc: 0-No pain  ? ?Pain Goal: Patients Stated Pain Goal: 1 (09/25/21 2300) ? ?  ?  ?  ?  ?  ?  ?  ? ?Kristi Hyer Hristova ? ? ? ? ?

## 2021-09-26 NOTE — Progress Notes (Signed)
Subjective: ?Postpartum Day # 1 : S/P NSVD due to pt was admitted on 4/13 at 40 weeks for IOL of maternal obesity, hsv+, no lesions, gbs-, new onset GHTN pcr 0.09, asymptomatic, pree labs unremarkable, BP today 128/78, started on procardia 30mg  XL daily on 4/15. Pt progressed with cytotec, pitocin and arom to SVD on 4/15 over intact perineum, ebl 5/15 was given PP TXA and pit prophylactics, hgb drop of 10.6-10.1.  Had baby female whom desires in pt circ prior to discharge. Pt breastfeeding, BC nexplanon desired at 6 weeks PP. Patient up ad lib, denies syncope or dizziness. Reports consuming regular diet without issues and denies N/V. Patient reports 0 bowel movement + passing flatus.  Denies issues with urination and reports bleeding is "lighter."  Patient is breastfeeding and reports going well.  Desires nexplanon for postpartum contraception.  Pain is being appropriately managed with use of po meds. Denies HA, RUQ pain or vision changes.  ? ?no laceration ?Feeding:  breast ?Contraceptive plan:  nexplanon ?BB: Circ in pt desired.  ? ?Objective: ?Vital signs in last 24 hours: ?Patient Vitals for the past 24 hrs: ? BP Temp Temp src Pulse Resp SpO2  ?09/26/21 0700 128/78 -- -- 80 -- --  ?09/26/21 0405 118/83 -- -- 81 -- --  ?09/26/21 0300 135/89 (!) 97.5 ?F (36.4 ?C) Oral 89 17 100 %  ?09/26/21 0130 134/79 -- -- 81 -- --  ?09/25/21 2300 (!) 148/100 98.4 ?F (36.9 ?C) Oral 87 18 100 %  ?09/25/21 2200 (!) 144/95 98.2 ?F (36.8 ?C) Oral 83 18 100 %  ?09/25/21 2100 135/83 -- -- 80 -- --  ?09/25/21 2046 129/75 -- -- 82 -- --  ?09/25/21 2031 (!) 145/86 -- -- 84 -- --  ?09/25/21 2016 (!) 141/87 -- -- 89 -- --  ?09/25/21 2002 134/77 -- -- 89 -- --  ?09/25/21 1942 -- 99 ?F (37.2 ?C) Axillary -- -- --  ?09/25/21 1901 (!) 144/89 -- -- 85 -- --  ?09/25/21 1831 (!) 141/86 -- -- 90 -- --  ?09/25/21 1701 (!) 142/94 -- -- 91 -- --  ?09/25/21 1630 -- 98.6 ?F (37 ?C) Oral -- -- --  ?09/25/21 1601 132/79 -- -- 81 -- --  ?09/25/21 1501  135/82 -- -- 93 -- --  ?09/25/21 1431 118/86 -- -- 92 -- --  ?09/25/21 1401 (!) 155/94 -- -- 79 -- --  ?09/25/21 1330 (!) 143/86 -- -- 83 -- --  ?09/25/21 1309 (!) 145/90 -- -- 88 -- --  ?09/25/21 1301 130/73 -- -- 83 -- --  ?09/25/21 1230 139/80 -- -- 90 -- --  ?09/25/21 1200 (!) 155/89 -- -- 79 -- --  ?09/25/21 1130 (!) 144/90 98 ?F (36.7 ?C) Oral 80 -- --  ?09/25/21 1102 (!) 138/56 -- -- (!) 109 -- --  ?09/25/21 1031 139/77 -- -- 82 -- --  ?09/25/21 1000 137/80 -- -- 79 -- --  ?09/25/21 0936 (!) 141/80 -- -- 83 -- --  ?09/25/21 0901 (!) 155/96 -- -- 79 -- --  ?  ? ?Physical Exam:  ?General: alert, cooperative, and appears stated age ?Mood/Affect: happy ?Lungs: clear to auscultation, no wheezes, rales or rhonchi, symmetric air entry.  ?Heart: normal rate, regular rhythm, normal S1, S2, no murmurs, rubs, clicks or gallops. ?Breast: breasts appear normal, no suspicious masses, no skin or nipple changes or axillary nodes. ?Abdomen:  + bowel sounds, soft, non-tender ?GU: perineum intact, healing well. No signs of external hematomas.  ?Uterine Fundus: firm ?Lochia:  appropriate ?Skin: Warm, Dry. ?DVT Evaluation: No evidence of DVT seen on physical exam. ?Negative Homan's sign. ?No cords or calf tenderness. ?No significant calf/ankle edema. ? ? ?  Latest Ref Rng & Units 09/26/2021  ?  4:47 AM 09/25/2021  ?  9:26 PM 09/24/2021  ?  8:48 PM  ?CBC  ?WBC 4.0 - 10.5 K/uL 13.8   14.4   8.3    ?Hemoglobin 12.0 - 15.0 g/dL 38.9   37.3   42.8    ?Hematocrit 36.0 - 46.0 % 30.0   33.4   29.2    ?Platelets 150 - 400 K/uL 214   238   209    ? ? ?Results for orders placed or performed during the hospital encounter of 09/23/21 (from the past 24 hour(s))  ?CBC with Differential/Platelet     Status: Abnormal  ? Collection Time: 09/25/21  9:26 PM  ?Result Value Ref Range  ? WBC 14.4 (H) 4.0 - 10.5 K/uL  ? RBC 3.80 (L) 3.87 - 5.11 MIL/uL  ? Hemoglobin 11.3 (L) 12.0 - 15.0 g/dL  ? HCT 33.4 (L) 36.0 - 46.0 %  ? MCV 87.9 80.0 - 100.0 fL  ? MCH  29.7 26.0 - 34.0 pg  ? MCHC 33.8 30.0 - 36.0 g/dL  ? RDW 15.0 11.5 - 15.5 %  ? Platelets 238 150 - 400 K/uL  ? nRBC 0.0 0.0 - 0.2 %  ? Neutrophils Relative % 80 %  ? Neutro Abs 11.7 (H) 1.7 - 7.7 K/uL  ? Lymphocytes Relative 10 %  ? Lymphs Abs 1.4 0.7 - 4.0 K/uL  ? Monocytes Relative 8 %  ? Monocytes Absolute 1.1 (H) 0.1 - 1.0 K/uL  ? Eosinophils Relative 1 %  ? Eosinophils Absolute 0.1 0.0 - 0.5 K/uL  ? Basophils Relative 0 %  ? Basophils Absolute 0.0 0.0 - 0.1 K/uL  ? Immature Granulocytes 1 %  ? Abs Immature Granulocytes 0.08 (H) 0.00 - 0.07 K/uL  ?CBC     Status: Abnormal  ? Collection Time: 09/26/21  4:47 AM  ?Result Value Ref Range  ? WBC 13.8 (H) 4.0 - 10.5 K/uL  ? RBC 3.40 (L) 3.87 - 5.11 MIL/uL  ? Hemoglobin 10.1 (L) 12.0 - 15.0 g/dL  ? HCT 30.0 (L) 36.0 - 46.0 %  ? MCV 88.2 80.0 - 100.0 fL  ? MCH 29.7 26.0 - 34.0 pg  ? MCHC 33.7 30.0 - 36.0 g/dL  ? RDW 15.0 11.5 - 15.5 %  ? Platelets 214 150 - 400 K/uL  ? nRBC 0.0 0.0 - 0.2 %  ?Comprehensive metabolic panel     Status: Abnormal  ? Collection Time: 09/26/21  4:47 AM  ?Result Value Ref Range  ? Sodium 134 (L) 135 - 145 mmol/L  ? Potassium 3.9 3.5 - 5.1 mmol/L  ? Chloride 109 98 - 111 mmol/L  ? CO2 20 (L) 22 - 32 mmol/L  ? Glucose, Bld 68 (L) 70 - 99 mg/dL  ? BUN <5 (L) 6 - 20 mg/dL  ? Creatinine, Ser 0.58 0.44 - 1.00 mg/dL  ? Calcium 8.1 (L) 8.9 - 10.3 mg/dL  ? Total Protein 4.8 (L) 6.5 - 8.1 g/dL  ? Albumin 2.2 (L) 3.5 - 5.0 g/dL  ? AST 15 15 - 41 U/L  ? ALT 12 0 - 44 U/L  ? Alkaline Phosphatase 142 (H) 38 - 126 U/L  ? Total Bilirubin 0.3 0.3 - 1.2 mg/dL  ? GFR, Estimated >60 >60 mL/min  ?  Anion gap 5 5 - 15  ?  ? ?CBG (last 3)  ?No results for input(s): GLUCAP in the last 72 hours.  ? ?I/O last 3 completed shifts: ?In: 6918.3 [P.O.:540; I.V.:6306.3; Other:72] ?Out: 975 [Urine:400; Emesis/NG output:300; Blood:275]  ? ?Assessment ?Postpartum Day # 1 : S/P NSVD due to pt was admitted on 4/13 at 40 weeks for IOL of maternal obesity, hsv+, no lesions, gbs-, new  onset GHTN pcr 0.09, asymptomatic, pree labs unremarkable, BP today 128/78, started on procardia 30mg  XL daily on 4/15. Pt progressed with cytotec, pitocin and arom to SVD on 4/15 over intact perineum, ebl 275mls was given PP TXA and pit prophylactics, hgb drop of 10.6-10.1.  Had baby female whom desires in pt circ prior to discharge. Pt breastfeeding, BC nexplanon desired at 6 weeks PP. Pt stable. -1 involution. Breastfeeding. Hemodynamically stable.  ? ?Plan: ?Continue other mgmt as ordered ?VTE prophylactics: Early ambulated as tolerates.  ?Pain control: Motrin/Tylenol PRN ?Education given regarding options for contraception, including barrier methods, injectable contraception, IUD placement, oral contraceptives.  ?Plan for discharge tomorrow, Breastfeeding, Lactation consult, Circumcision prior to discharge, and Contraception nexplanon 6 weeks PP.  ?GHNT: monitor bp and continue procardia 30mg  XL daily, 1 week BP check out pt.  ?Dr. Sallye OberKulwa to be updated on patient status ? ?[facility] Delawrence Fridman NP-C, CNM ?09/26/2021, 8:50 AM ?  ?

## 2021-09-26 NOTE — Lactation Note (Signed)
This note was copied from a baby's chart. ?Lactation Consultation Note ? ?Patient Name: Donna Chavez ?Today's Date: 09/26/2021 ?  ?Age:33 hours ? ? ?LC Note: ? ?Attempted to visit with family, however, mother declined at this time.  Asked her to call when it is convenient for her and I will return for an initial visit.  RN in room and aware. ? ? ?Maternal Data ?  ? ?Feeding ?  ? ?LATCH Score ?  ? ?  ? ?  ? ?  ? ?  ? ?  ? ? ?Lactation Tools Discussed/Used ?  ? ?Interventions ?  ? ?Discharge ?  ? ?Consult Status ?  ? ? ? ?Armen Waring R Anae Hams ?09/26/2021, 11:07 AM ? ? ? ?
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
## 2021-09-27 ENCOUNTER — Other Ambulatory Visit (HOSPITAL_COMMUNITY): Payer: Self-pay

## 2021-09-27 MED ORDER — NIFEDIPINE ER 30 MG PO TB24
30.0000 mg | ORAL_TABLET | Freq: Every day | ORAL | 3 refills | Status: AC
  Filled 2021-09-27: qty 30, 30d supply, fill #0

## 2021-09-27 MED ORDER — IBUPROFEN 600 MG PO TABS
600.0000 mg | ORAL_TABLET | Freq: Four times a day (QID) | ORAL | 3 refills | Status: AC | PRN
  Filled 2021-09-27: qty 60, 15d supply, fill #0

## 2021-09-27 MED ORDER — ACETAMINOPHEN 325 MG PO TABS
650.0000 mg | ORAL_TABLET | ORAL | 3 refills | Status: AC | PRN
  Filled 2021-09-27: qty 60, 5d supply, fill #0

## 2021-09-27 MED ORDER — SENNOSIDES-DOCUSATE SODIUM 8.6-50 MG PO TABS
2.0000 | ORAL_TABLET | Freq: Every evening | ORAL | 3 refills | Status: AC | PRN
  Filled 2021-09-27: qty 60, 30d supply, fill #0

## 2021-10-05 ENCOUNTER — Telehealth (HOSPITAL_COMMUNITY): Payer: Self-pay | Admitting: *Deleted

## 2021-10-05 NOTE — Telephone Encounter (Signed)
Mom reports feeling good. No concerns about herself physically at this time. Emotionally feeling anxious. EPDS=10 [facility] score=0) Nigel Bridgeman CNM faxed score as mom reports provider appt scheduled for tomorrow. ?Mom reports baby is doing well. Feeding, peeing, and pooping without difficulty. Safe sleep reviewed. Mom reports no concerns about baby at present. ? ?Duffy Rhody, RN 10-05-2021 at 1:20pm ?
